# Patient Record
Sex: Female | Born: 1980 | Race: Black or African American | Hispanic: No | Marital: Single | Smoking: Current some day smoker
Health system: Southern US, Community
[De-identification: ages and names within clinical notes are randomized; demographics above are authoritative.]

## PROBLEM LIST (undated history)

## (undated) ENCOUNTER — Inpatient Hospital Stay (HOSPITAL_COMMUNITY): Payer: Self-pay

## (undated) DIAGNOSIS — A749 Chlamydial infection, unspecified: Secondary | ICD-10-CM

## (undated) DIAGNOSIS — F419 Anxiety disorder, unspecified: Secondary | ICD-10-CM

## (undated) HISTORY — PX: NO PAST SURGERIES: SHX2092

## (undated) HISTORY — DX: Chlamydial infection, unspecified: A74.9

---
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
## 2001-05-21 ENCOUNTER — Encounter: Payer: Self-pay | Admitting: Family Medicine

## 2001-05-21 ENCOUNTER — Ambulatory Visit (HOSPITAL_COMMUNITY): Admission: RE | Admit: 2001-05-21 | Discharge: 2001-05-21 | Payer: Self-pay | Admitting: Family Medicine

## 2003-10-01 ENCOUNTER — Emergency Department (HOSPITAL_COMMUNITY): Admission: EM | Admit: 2003-10-01 | Discharge: 2003-10-01 | Payer: Self-pay | Admitting: Emergency Medicine

## 2004-10-12 ENCOUNTER — Inpatient Hospital Stay (HOSPITAL_COMMUNITY): Admission: AD | Admit: 2004-10-12 | Discharge: 2004-10-14 | Payer: Self-pay | Admitting: Obstetrics

## 2005-11-05 ENCOUNTER — Inpatient Hospital Stay (HOSPITAL_COMMUNITY): Admission: AD | Admit: 2005-11-05 | Discharge: 2005-11-05 | Payer: Self-pay | Admitting: Obstetrics

## 2006-10-12 ENCOUNTER — Emergency Department (HOSPITAL_COMMUNITY): Admission: EM | Admit: 2006-10-12 | Discharge: 2006-10-12 | Payer: Self-pay | Admitting: *Deleted

## 2007-04-21 ENCOUNTER — Emergency Department (HOSPITAL_COMMUNITY): Admission: EM | Admit: 2007-04-21 | Discharge: 2007-04-21 | Payer: Self-pay | Admitting: Emergency Medicine

## 2008-05-20 ENCOUNTER — Emergency Department (HOSPITAL_COMMUNITY): Admission: EM | Admit: 2008-05-20 | Discharge: 2008-05-20 | Payer: Self-pay | Admitting: Emergency Medicine

## 2009-05-12 ENCOUNTER — Emergency Department (HOSPITAL_COMMUNITY): Admission: EM | Admit: 2009-05-12 | Discharge: 2009-05-12 | Payer: Self-pay | Admitting: Emergency Medicine

## 2009-08-13 ENCOUNTER — Emergency Department (HOSPITAL_COMMUNITY): Admission: EM | Admit: 2009-08-13 | Discharge: 2009-08-13 | Payer: Self-pay | Admitting: Emergency Medicine

## 2009-09-18 ENCOUNTER — Ambulatory Visit (HOSPITAL_COMMUNITY): Admission: RE | Admit: 2009-09-18 | Discharge: 2009-09-18 | Payer: Self-pay | Admitting: Obstetrics

## 2010-02-12 ENCOUNTER — Inpatient Hospital Stay (HOSPITAL_COMMUNITY): Admission: AD | Admit: 2010-02-12 | Discharge: 2010-02-15 | Payer: Self-pay | Admitting: Obstetrics

## 2010-10-21 LAB — CBC
HCT: 32.5 % — ABNORMAL LOW (ref 36.0–46.0)
HCT: 35 % — ABNORMAL LOW (ref 36.0–46.0)
Hemoglobin: 11.3 g/dL — ABNORMAL LOW (ref 12.0–15.0)
Hemoglobin: 12.2 g/dL (ref 12.0–15.0)
MCH: 32.9 pg (ref 26.0–34.0)
MCH: 33 pg (ref 26.0–34.0)
MCHC: 34.7 g/dL (ref 30.0–36.0)
MCHC: 34.8 g/dL (ref 30.0–36.0)
MCV: 94.7 fL (ref 78.0–100.0)
MCV: 94.8 fL (ref 78.0–100.0)
Platelets: 249 10*3/uL (ref 150–400)
Platelets: 306 10*3/uL (ref 150–400)
RBC: 3.43 MIL/uL — ABNORMAL LOW (ref 3.87–5.11)
RBC: 3.69 MIL/uL — ABNORMAL LOW (ref 3.87–5.11)
RDW: 14.2 % (ref 11.5–15.5)
RDW: 14.4 % (ref 11.5–15.5)
WBC: 10.5 10*3/uL (ref 4.0–10.5)
WBC: 17.9 10*3/uL — ABNORMAL HIGH (ref 4.0–10.5)

## 2010-10-21 LAB — SYPHILIS: RPR W/REFLEX TO RPR TITER AND TREPONEMAL ANTIBODIES, TRADITIONAL SCREENING AND DIAGNOSIS ALGORITHM: RPR Ser Ql: NONREACTIVE

## 2011-11-12 ENCOUNTER — Emergency Department (HOSPITAL_COMMUNITY)
Admission: EM | Admit: 2011-11-12 | Discharge: 2011-11-12 | Disposition: A | Discharge status: Home / Self Care | Payer: Medicaid Other | Attending: Emergency Medicine | Admitting: Emergency Medicine

## 2011-11-12 ENCOUNTER — Encounter (HOSPITAL_COMMUNITY): Payer: Self-pay | Admitting: Emergency Medicine

## 2011-11-12 ENCOUNTER — Emergency Department (HOSPITAL_COMMUNITY): Payer: Medicaid Other

## 2011-11-12 DIAGNOSIS — F410 Panic disorder [episodic paroxysmal anxiety] without agoraphobia: Secondary | ICD-10-CM | POA: Insufficient documentation

## 2011-11-12 DIAGNOSIS — R630 Anorexia: Secondary | ICD-10-CM | POA: Insufficient documentation

## 2011-11-12 DIAGNOSIS — F341 Dysthymic disorder: Secondary | ICD-10-CM | POA: Insufficient documentation

## 2011-11-12 HISTORY — DX: Anxiety disorder, unspecified: F41.9

## 2011-11-12 MED ORDER — ALPRAZOLAM 0.5 MG PO TABS: 0.5000 mg | ORAL_TABLET | Freq: Three times a day (TID) | ORAL | Status: AC | PRN

## 2011-11-12 NOTE — ED Provider Notes (Signed)
Medical screening examination/treatment/procedure(s) were performed by non-physician practitioner and as supervising physician I was immediately available for consultation/collaboration.  Quinisha Mould, MD 11/12/11 2318 

## 2011-11-12 NOTE — ED Notes (Signed)
Pt reports for the past week with panic attacks. Pt reports had similar episodes in the past. Pt reports unable to sit still and mind racing. Pt denies SI/HI.

## 2011-11-12 NOTE — ED Notes (Signed)
Pt d/c home in NAD. Pt voiced understanding of d/c instructions and prescriptions. Instructed not to drive after taking xanax.

## 2011-11-12 NOTE — ED Notes (Addendum)
Pt states that she has had anxiety attacks xseveral years. Saw counselor a couple of years back but has cont to have anxiety. Pt states that she is here because "I am older and hoped these would have gone away by now." Denies anxiety today but states she did have 1 yesterday. Wants help in finding a place to go for psych help. States currently she feels like she has never been able to concentrate on things because she is always getting distracted.

## 2011-11-12 NOTE — Discharge Instructions (Signed)
Read the information below.  Please call the phone number above for [facility] (mental health facility) for further evaluation and treatment of your depression and anxiety.  Please also use the resources below to find a primary care provider for follow up.  You may return to the ER at any time for worsening condition or any new symptoms that concern you.   Anxiety and Panic Attacks Your caregiver has informed you that you are having an anxiety or panic attack. There may be many forms of this. Most of the time these attacks come suddenly and without warning. They come at any time of day, including periods of sleep, and at any time of life. They may be strong and unexplained. Although panic attacks are very scary, they are physically harmless. Sometimes the cause of your anxiety is not known. Anxiety is a protective mechanism of the body in its fight or flight mechanism. Most of these perceived danger situations are actually nonphysical situations (such as anxiety over losing a job). CAUSES  The causes of an anxiety or panic attack are many. Panic attacks may occur in otherwise healthy people given a certain set of circumstances. There may be a genetic cause for panic attacks. Some medications may also have anxiety as a side effect. SYMPTOMS  Some of the most common feelings are:  Intense terror.   Dizziness, feeling faint.   Hot and cold flashes.   Fear of going crazy.   Feelings that nothing is real.   Sweating.   Shaking.   Chest pain or a fast heartbeat (palpitations).   Smothering, choking sensations.   Feelings of impending doom and that death is near.   Tingling of extremities, this may be from over-breathing.   Altered reality (derealization).   Being detached from yourself (depersonalization).  Several symptoms can be present to make up anxiety or panic attacks. DIAGNOSIS  The evaluation by your caregiver will depend on the type of symptoms you are experiencing. The diagnosis  of anxiety or panic attack is made when no physical illness can be determined to be a cause of the symptoms. TREATMENT  Treatment to prevent anxiety and panic attacks may include:  Avoidance of circumstances that cause anxiety.   Reassurance and relaxation.   Regular exercise.   Relaxation therapies, such as yoga.   Psychotherapy with a psychiatrist or therapist.   Avoidance of caffeine, alcohol and illegal drugs.   Prescribed medication.  SEEK IMMEDIATE MEDICAL CARE IF:   You experience panic attack symptoms that are different than your usual symptoms.   You have any worsening or concerning symptoms.  Document Released: 07/22/2005 Document Revised: 07/11/2011 Document Reviewed: 11/23/2009 [facility] Patient Information 2012 [place], [place].  If you have no primary doctor, here are some resources that may be helpful:  Medicaid-accepting [facility] Providers:   - [facility]- [address]      [phone]      Mon-Fri 9am-7pm, Sat 9am-1pm   - [facility]- [address] [place]      [phone]   - [facility]- [address] [place]      [phone]   [facility]- [address]      [phone]   - Renaye Rakers- [address] [place], [address]      [phone]      Only accepts [place] patients       after they  have her name applied to their card   Self Pay (no insurance) in [facility]:   - Sickle Cell Patients: Dr Willey Blade, [facility]      [address] [place]      [phone]   - [facility] [phone]   - Physician Referral Service- [phone]   - [facility]- [address] [place]      [phone]   [facility] [place]- [address]   - [facility]- see information above      (Speak to [employer] if you do not have insurance)   - [facility]- [address] [place]      [phone]   - [facility] [place]- [address]      [phone]   - [facility]- [address]      [phone]   - Dr Julio Sicks-  [address], [place]      [phone]   - [facility]- [address]      [phone]   - [facility]- [address]      [phone]      Also [address]      [phone]   - [facility]- [address]      [phone]      1st and 3rd Saturday every month, 10am-1pm Other agencies that provide inexpensive medical care:    [facility]  [phone]    [facility]  [phone]    [facility]  [phone]    [facility]  [phone]    [facility]  [phone]  General Information: Finding a doctor when you do not have health insurance can be tricky. Although you are not limited by an insurance plan, you are of course limited by her finances and how much but he can pay out of pocket.  What are your options if you don't have health insurance?   1) Find a Librarian, academic and Pay Out of Pocket Although you won't have to find out who is covered by your insurance plan, it is a good idea to ask around and get recommendations. You will then need to call the office and see if the doctor you have chosen will accept you as a new patient and what types of options they offer for patients who are self-pay. Some doctors offer discounts or will set up payment plans for their patients who do not have insurance, but you will need to ask so you aren't surprised when you get to your appointment.  2) Contact Your Local Health Department Not all health departments have doctors that can see patients for sick visits, but many do, so it is worth a call to see if yours does. If you don't know where your local health department is, you can check in your phone book. The CDC also has a tool to help you locate your state's health department, and many state websites also  have listings of all of their local health departments.  3) Find a Walk-in Clinic If your illness is not likely to be very severe or complicated, you may want to try a walk in clinic. These are popping up all over the country in pharmacies, drugstores, and shopping centers. They're usually staffed by nurse practitioners or physician assistants that have been trained to treat common illnesses  and complaints. They're usually fairly quick and inexpensive. However, if you have serious medical issues or chronic medical problems, these are probably not your best option  RESOURCE GUIDE  Dental Problems  Patients with Medicaid: [facility] [phone] [address]                                           [phone] [address] Phone:  [phone]                                                  Phone:  [phone]  If unable to pay or uninsured, contact:  [facility] or [facility]. to become qualified for the adult dental clinic.  Chronic Pain Problems Contact [facility]  [phone] Patients need to be referred by their primary care doctor.  Insufficient Money for Medicine Contact United Way:  call "211" or [facility] [phone].  No Primary Care Doctor Call [facility]  [phone] Other agencies that provide inexpensive medical care    [facility]  [phone]    [facility]  [phone]    [facility]  [phone]    [facility]  [phone]    [facility]  [phone]    [facility]  [phone]  Psychological Services [facility]  [phone] [facility] Services  [phone] [facility]   [phone] (emergency services [phone])  Substance Abuse Resources [facility] and Drug Services  [phone] [facility] [phone] [facility] [phone] Floydene Flock [phone] Residential & Outpatient Substance  Abuse Program  [phone]  Abuse/Neglect [facility] Child Abuse Hotline [phone])182-2732 [facility] Child Abuse Hotline 9563797321 (After Hours)  Emergency Shelter [facility] [phone]  Maternity Homes [facility] 9126236912 [facility] [phone]  MRSA Hotline #:   908-165-9932    [facility] Resources  [facility]     United Way                          [facility] Dept. [address] [place]                       [address]      [address]  [place]                                                Cristobal Goldmann Phone:  [phone]                                   Phone:  [phone]  Phone:  [phone]  [facility] Phone:  [phone]  [facility] Child Abuse Hotline 604-335-8958 [phone] (After Hours)

## 2011-11-12 NOTE — ED Provider Notes (Signed)
History     CSN: [id number]  Arrival date & time 11/12/11  1148   First MD Initiated Contact with Patient 11/12/11 1248      Chief Complaint  Patient presents with  . Panic Attack    (Consider location/radiation/quality/duration/timing/severity/associated sxs/prior treatment) HPI Comments: Patient states that for the past several years she has had panic attacks.  States that she suddenly because short of breath, occasionally with chest pain, occasionally with lightheadedness.  States that she always feels like she can't get any air into her lungs.  The symptoms are exacerbated by worrying or thinking about stressful things.  States that she is a very emotional person and takes things a lot harder than other people.  States when she is alone she feels depressed, sad and lonely, and decreased appetite.  Denies SI, HI.  States last episode was 2 days ago.  Denies current SOB or CP.    The history is provided by the patient.    Past Medical History  Diagnosis Date  . Anxiety     History reviewed. No pertinent past surgical history.  History reviewed. No pertinent family history.  History  Substance Use Topics  . Smoking status: Current Everyday Smoker  . Smokeless tobacco: Not on file  . Alcohol Use: Yes    OB History    Grav Para Term Preterm Abortions TAB SAB Ect Mult Living                  Review of Systems  Constitutional: Negative for fever.  Respiratory: Negative for cough.   Gastrointestinal: Negative for vomiting, abdominal pain and diarrhea.  Genitourinary: Negative for dysuria, urgency, frequency and menstrual problem.  Neurological: Negative for dizziness, syncope, weakness and numbness.  All other systems reviewed and are negative.    Allergies  Review of patient's allergies indicates no known allergies.  Home Medications  No current outpatient prescriptions on file.  BP 109/71  Pulse 80  Temp(Src) 98.1 F (36.7 C) (Oral)  Resp 18  SpO2 100%   LMP 11/05/2011  Physical Exam  Nursing note and vitals reviewed. Constitutional: She is oriented to person, place, and time. She appears well-developed and well-nourished. No distress.  HENT:  Head: Normocephalic and atraumatic.  Neck: Normal range of motion. Neck supple.  Cardiovascular: Normal rate, regular rhythm and normal heart sounds.   Pulmonary/Chest: Breath sounds normal. No respiratory distress. She has no wheezes. She has no rales. She exhibits no tenderness.  Abdominal: Soft. Bowel sounds are normal. She exhibits no distension and no mass. There is no tenderness. There is no rebound and no guarding.  Musculoskeletal: Normal range of motion.  Neurological: She is alert and oriented to person, place, and time.  Skin: She is not diaphoretic.  Psychiatric: She has a normal mood and affect. Her behavior is normal. Judgment and thought content normal.    ED Course  Procedures (including critical care time)  Labs Reviewed - No data to display Dg Chest 2 View  11/12/2011  *RADIOLOGY REPORT*  Clinical Data: Panic attack, shortness of breath, smoker.  CHEST - 2 VIEW  Comparison: 05/20/2008  Findings: Hyperinflation consistent with COPD.  No infiltrates or nodules.  Bilateral nipple shadows.   Normal cardiac and mediastinal silhouette.   No effusion or pneumothorax.    Slight scoliosis convex right.  IMPRESSION: Hyperinflation consistent with COPD.  Similar appearance to priors.  Original Report Authenticated By: Elsie Stain, M.D.    Date: 11/12/2011   Rate: 73  Rhythm: normal sinus rhythm and sinus arrhythmia  QRS Axis: normal  Intervals: normal  ST/T Wave abnormalities: normal  Conduction Disutrbances:none  Narrative Interpretation:   Old EKG Reviewed: none available    1. Panic attacks       MDM  Patient with history of presumed panic attacks.  These have been ongoing and unchanged for years.  Patient came to ED for medication and resources.  Patient's last panic  attack was two days ago.  She was without symptoms throughout ED visit.  D/C home with resources for [facility] (mental health) as well as PCP.  Short term prescription for xanax given.          Dillard Cannon [place], [place] 11/12/11 1538

## 2012-03-06 ENCOUNTER — Inpatient Hospital Stay (HOSPITAL_COMMUNITY)
Admission: AD | Admit: 2012-03-06 | Discharge: 2012-03-06 | Discharge status: Against Medical Advice | Payer: Medicaid Other | Source: Ambulatory Visit | Attending: Obstetrics and Gynecology | Admitting: Obstetrics and Gynecology

## 2012-03-06 DIAGNOSIS — R109 Unspecified abdominal pain: Secondary | ICD-10-CM | POA: Insufficient documentation

## 2012-03-06 DIAGNOSIS — R110 Nausea: Secondary | ICD-10-CM | POA: Insufficient documentation

## 2012-03-06 LAB — URINALYSIS, ROUTINE W REFLEX MICROSCOPIC
Bilirubin Urine: NEGATIVE
Ketones, ur: NEGATIVE mg/dL
Leukocytes, UA: NEGATIVE
Protein, ur: NEGATIVE mg/dL
pH: 6 (ref 5.0–8.0)

## 2012-03-06 LAB — URINE MICROSCOPIC-ADD ON

## 2012-03-06 LAB — POCT PREGNANCY, URINE: Preg Test, Ur: NEGATIVE

## 2012-03-06 NOTE — MAU Note (Signed)
Patient states she has had nausea all week. Had upper abdominal pain earlier today but not in pain at this time. No bleeding.

## 2012-03-06 NOTE — MAU Note (Signed)
Called, not in lobby 

## 2015-04-25 ENCOUNTER — Encounter (HOSPITAL_COMMUNITY): Payer: Self-pay | Admitting: Emergency Medicine

## 2015-04-25 ENCOUNTER — Emergency Department (HOSPITAL_COMMUNITY)
Admission: EM | Admit: 2015-04-25 | Discharge: 2015-04-25 | Disposition: A | Discharge status: Home / Self Care | Payer: Medicaid Other | Attending: Emergency Medicine | Admitting: Emergency Medicine

## 2015-04-25 DIAGNOSIS — R0602 Shortness of breath: Secondary | ICD-10-CM | POA: Diagnosis present

## 2015-04-25 DIAGNOSIS — F419 Anxiety disorder, unspecified: Secondary | ICD-10-CM | POA: Insufficient documentation

## 2015-04-25 DIAGNOSIS — Z720 Tobacco use: Secondary | ICD-10-CM | POA: Diagnosis not present

## 2015-04-25 MED ORDER — BUSPIRONE HCL 10 MG PO TABS: 10.0000 mg | ORAL_TABLET | Freq: Three times a day (TID) | ORAL | Status: DC

## 2015-04-25 NOTE — Discharge Instructions (Signed)
Please use medication as directed, please follow-up with your primary care provider or Behavioral Health specialist for further evaluation and management.

## 2015-04-25 NOTE — ED Provider Notes (Signed)
CSN: [id number]     Arrival date & time 04/25/15  0925 History  This chart was scribed for non-physician practitioner, Eyvonne Mechanic, PA-C, working with Derwood Kaplan, MD, by Ronney Lion, ED Scribe. This patient was seen in room TR08C/TR08C and the patient's care was started at 10:26 AM.    Chief Complaint  Patient presents with  . Panic Attack    The history is provided by the patient. No language interpreter was used.    HPI Comments: Priscilla Sherman is a 34 y.o. female with a history of anxiety, who presents to the Emergency Department complaining of feeling anxious. She states she feels like she cannot "catch her breath" and feels lightheaded, and she is unable to relax. She tried to be seen here yesterday for the same but went home due to the 4-5 hour wait. She states her symptoms persisted overnight, so she returned this morning. Patient report  she was diagnosed with anxiety, depression, and ADHD at [facility] several months ago and given medication for depression and anxiety, although she is unable to recall which medication this was - she states she thinks it "begins with a 'C'." She states she tried taking it for a month, but she stopped taking it 2 weeks ago because it was ineffective and made her feel "not right." Patient has a follow-up appointment with Vesta Mixer in October. Patient states she initially went to [facility] for chronic anxiety to seek out medication, as she has been self-medicating with marijuana for some time but would like to stop doing this as soon as she finds finds effective long-term anxiety treatment. Patient denies chest pain or any other complaints.   Past Medical History  Diagnosis Date  . Anxiety    History reviewed. No pertinent past surgical history. No family history on file. Social History  Substance Use Topics  . Smoking status: Current Every Day Smoker -- 0.10 packs/day    Types: Cigarettes  . Smokeless tobacco: None  . Alcohol Use: Yes     Comment: socially    OB History    No data available     Review of Systems  All other systems reviewed and are negative.  Allergies  Review of patient's allergies indicates no known allergies.  Home Medications   Prior to Admission medications   Medication Sig Start Date End Date Taking? Authorizing Provider  busPIRone (BUSPAR) 10 MG tablet Take 1 tablet (10 mg total) by mouth 3 (three) times daily. 04/25/15   Jeffrey Hedges, PA-C   BP 123/79 mmHg  Pulse 61  Temp(Src) 98.3 F (36.8 C) (Oral)  Resp 18  Ht  (1.575 m)  Wt 130 lb (58.968 kg)  BMI 23.77 kg/m2  SpO2 100%  LMP 04/25/2015   Physical Exam  Constitutional: She is oriented to person, place, and time. She appears well-developed and well-nourished. No distress.  Somewhat anxious during evaluation  HENT:  Head: Normocephalic and atraumatic.  Eyes: Conjunctivae and EOM are normal.  Neck: Neck supple. No tracheal deviation present.  Cardiovascular: Normal rate, regular rhythm, normal heart sounds and intact distal pulses.  Exam reveals no gallop and no friction rub.   No murmur heard. Pulmonary/Chest: Effort normal. No respiratory distress.  Musculoskeletal: Normal range of motion.  Neurological: She is alert and oriented to person, place, and time.  Skin: Skin is warm and dry.  Psychiatric: She has a normal mood and affect. Her behavior is normal.  Nursing note and vitals reviewed.   ED Course  Procedures (  including critical care time)  MDM   Final diagnoses:  Anxiety    Labs: n/a  Imaging: n/a  Consults: n/a  Therapeutics: n/a  Discharge Meds: Buspar 10 mg TID  Assessment/Plan: Patient here with anxiety. History of the same, with no improvement in symptoms with previously prescribed medications. Patient has no chest pain, significant shortness of breath, or any other concerning signs or symptoms for anything other than anxiety. Discussed treatment plan with pt, which includes Rx anti-anxiety medication. Pt  verbalized understanding and agreed to plan.  I personally performed the services described in this documentation, which was scribed in my presence. The recorded information has been reviewed and is accurate.     Eyvonne Mechanic, PA-C 04/25/15 1645  Derwood Kaplan, MD 04/25/15 [phone]

## 2015-04-25 NOTE — ED Notes (Signed)
Patient states she has been diagnosed in the last several months with anxiety, panic attacks, ADHD.   She states she was recently put on medication to cover her for anxiety, but she states it covers for depression also.  Patient states she has panic attacks where her heart beats fast and gets dizzy, but not having anything today.   Patient states she came in yesterday for same, but didn't want to wait.   Patient states her mind "moves around a lot and I'm light sleeping".

## 2015-04-25 NOTE — ED Notes (Signed)
PT was seen at [facility] and DX with ADD, Anxiety and depression. Pt was started on meds by [facility] but did not bring  The med with her today.

## 2015-06-01 ENCOUNTER — Inpatient Hospital Stay (HOSPITAL_COMMUNITY): Payer: Medicaid Other

## 2015-06-01 ENCOUNTER — Encounter (HOSPITAL_COMMUNITY): Payer: Self-pay

## 2015-06-01 ENCOUNTER — Inpatient Hospital Stay (HOSPITAL_COMMUNITY)
Admission: AD | Admit: 2015-06-01 | Discharge: 2015-06-01 | Disposition: A | Discharge status: Home / Self Care | Payer: Medicaid Other | Source: Ambulatory Visit | Attending: Family Medicine | Admitting: Family Medicine

## 2015-06-01 DIAGNOSIS — F17210 Nicotine dependence, cigarettes, uncomplicated: Secondary | ICD-10-CM | POA: Insufficient documentation

## 2015-06-01 DIAGNOSIS — N760 Acute vaginitis: Secondary | ICD-10-CM | POA: Diagnosis not present

## 2015-06-01 DIAGNOSIS — O23591 Infection of other part of genital tract in pregnancy, first trimester: Secondary | ICD-10-CM | POA: Diagnosis not present

## 2015-06-01 DIAGNOSIS — B9689 Other specified bacterial agents as the cause of diseases classified elsewhere: Secondary | ICD-10-CM

## 2015-06-01 DIAGNOSIS — A499 Bacterial infection, unspecified: Secondary | ICD-10-CM

## 2015-06-01 DIAGNOSIS — O99331 Smoking (tobacco) complicating pregnancy, first trimester: Secondary | ICD-10-CM | POA: Diagnosis not present

## 2015-06-01 DIAGNOSIS — O26899 Other specified pregnancy related conditions, unspecified trimester: Secondary | ICD-10-CM

## 2015-06-01 DIAGNOSIS — Z3A01 Less than 8 weeks gestation of pregnancy: Secondary | ICD-10-CM | POA: Diagnosis not present

## 2015-06-01 DIAGNOSIS — R109 Unspecified abdominal pain: Secondary | ICD-10-CM | POA: Insufficient documentation

## 2015-06-01 LAB — URINALYSIS, ROUTINE W REFLEX MICROSCOPIC
Bilirubin Urine: NEGATIVE
Glucose, UA: NEGATIVE mg/dL
Hgb urine dipstick: NEGATIVE
Ketones, ur: 15 mg/dL — AB
LEUKOCYTES UA: NEGATIVE
NITRITE: NEGATIVE
PROTEIN: NEGATIVE mg/dL
Specific Gravity, Urine: 1.025 (ref 1.005–1.030)
UROBILINOGEN UA: 4 mg/dL — AB (ref 0.0–1.0)
pH: 6 (ref 5.0–8.0)

## 2015-06-01 LAB — WET PREP, GENITAL
TRICH WET PREP: NONE SEEN
Yeast Wet Prep HPF POC: NONE SEEN

## 2015-06-01 LAB — HCG, QUANTITATIVE, PREGNANCY: HCG, BETA CHAIN, QUANT, S: 7393 m[IU]/mL — AB (ref <5)

## 2015-06-01 LAB — CBC
HCT: 36.9 % (ref 36.0–46.0)
HEMOGLOBIN: 12.8 g/dL (ref 12.0–15.0)
MCH: 31.1 pg (ref 26.0–34.0)
MCHC: 34.7 g/dL (ref 30.0–36.0)
MCV: 89.8 fL (ref 78.0–100.0)
Platelets: 364 10*3/uL (ref 150–400)
RBC: 4.11 MIL/uL (ref 3.87–5.11)
RDW: 13.5 % (ref 11.5–15.5)
WBC: 11 10*3/uL — ABNORMAL HIGH (ref 4.0–10.5)

## 2015-06-01 LAB — ABO/RH: ABO/RH(D): B POS

## 2015-06-01 LAB — OB RESULTS CONSOLE GC/CHLAMYDIA: GC PROBE AMP, GENITAL: NEGATIVE

## 2015-06-01 LAB — POCT PREGNANCY, URINE: PREG TEST UR: POSITIVE — AB

## 2015-06-01 MED ORDER — METRONIDAZOLE 500 MG PO TABS: 500.0000 mg | ORAL_TABLET | Freq: Two times a day (BID) | ORAL | Status: DC

## 2015-06-01 NOTE — MAU Note (Signed)
Patient presents with abdominal cramping, missed period, positive home UPT, reports having diarrhea for 2 weeks.

## 2015-06-01 NOTE — MAU Provider Note (Signed)
History     CSN: [id number]  Arrival date and time: 06/01/15 1434   First Provider Initiated Contact with Patient 06/01/15 1455      Chief Complaint  Patient presents with  . Abdominal Cramping  . Amenorrhea  . Diarrhea   HPI Pt is [redacted]w[redacted]d pregnant [id number] who presents with nausea x2 weeks and diarrheax2 weeks with abdominal cramping; Cramping does not seem to be related to diarrhea.  Pt denies fever, chills, UTI sx, spotting , bleeding or dyspareunia.  Pt has seen at [facility] with new prescription of wellbutrin and buspar- previously was on citalopram and didn't feel any relief. Pt thinks that could be responsible for her diarrhea- which seems to be getting better Pt has not taken any medications for her diarrhea or her cramping or nausea RN note:      Expand All Collapse All   Patient presents with abdominal cramping, missed period, positive home UPT, reports having diarrhea for 2 weeks.         Past Medical History  Diagnosis Date  . Anxiety     Past Surgical History  Procedure Laterality Date  . No past surgeries      No family history on file.  Social History  Substance Use Topics  . Smoking status: Current Every Day Smoker -- 0.10 packs/day    Types: Cigarettes  . Smokeless tobacco: None  . Alcohol Use: Yes     Comment: socially    Allergies: No Known Allergies  Prescriptions prior to admission  Medication Sig Dispense Refill Last Dose  . buPROPion (WELLBUTRIN XL) 150 MG 24 hr tablet Take 150 mg by mouth daily.   05/31/2015 at Unknown time  . busPIRone (BUSPAR) 10 MG tablet Take 1 tablet (10 mg total) by mouth 3 (three) times daily. 20 tablet 0 05/31/2015 at Unknown time    Review of Systems  Constitutional: Negative for fever and chills.  Gastrointestinal: Positive for nausea, abdominal pain and diarrhea. Negative for vomiting.  Neurological: Positive for dizziness. Negative for headaches.       Sometimes-   Psychiatric/Behavioral: The patient is  nervous/anxious.        Has been taking antianxiety medications from [facility]   Physical Exam   Blood pressure 126/79, pulse 90, temperature 98.5 F (36.9 C), temperature source Oral, resp. rate 16, last menstrual period 04/20/2015.  Physical Exam  Nursing note and vitals reviewed. Constitutional: She is oriented to person, place, and time. She appears well-developed and well-nourished. No distress.  HENT:  Head: Normocephalic.  Eyes: Pupils are equal, round, and reactive to light.  Neck: Normal range of motion. Neck supple.  Cardiovascular: Normal rate.   Respiratory: Effort normal.  GI: Soft.  Musculoskeletal: Normal range of motion.  Neurological: She is alert and oriented to person, place, and time.  Skin: Skin is warm and dry.  Psychiatric: She has a normal mood and affect.    MAU Course  Procedures Results for orders placed or performed during the hospital encounter of 06/01/15 (from the past 24 hour(s))  Urinalysis, Routine w reflex microscopic (not at [facility])     Status: Abnormal   Collection Time: 06/01/15  2:40 PM  Result Value Ref Range   Color, Urine YELLOW YELLOW   APPearance CLEAR CLEAR   Specific Gravity, Urine 1.025 1.005 - 1.030   pH 6.0 5.0 - 8.0   Glucose, UA NEGATIVE NEGATIVE mg/dL   Hgb urine dipstick NEGATIVE NEGATIVE   Bilirubin Urine NEGATIVE NEGATIVE   Ketones, ur  15 (A) NEGATIVE mg/dL   Protein, ur NEGATIVE NEGATIVE mg/dL   Urobilinogen, UA 4.0 (H) 0.0 - 1.0 mg/dL   Nitrite NEGATIVE NEGATIVE   Leukocytes, UA NEGATIVE NEGATIVE  Pregnancy, urine POC     Status: Abnormal   Collection Time: 06/01/15  2:44 PM  Result Value Ref Range   Preg Test, Ur POSITIVE (A) NEGATIVE  CBC     Status: Abnormal   Collection Time: 06/01/15  3:16 PM  Result Value Ref Range   WBC 11.0 (H) 4.0 - 10.5 K/uL   RBC 4.11 3.87 - 5.11 MIL/uL   Hemoglobin 12.8 12.0 - 15.0 g/dL   HCT 40.936.9 81.136.0 - 91.446.0 %   MCV 89.8 78.0 - 100.0 fL   MCH 31.1 26.0 - 34.0 pg   MCHC 34.7 30.0 -  36.0 g/dL   RDW 78.213.5 95.611.5 - 21.315.5 %   Platelets 364 150 - 400 K/uL  ABO/Rh     Status: None   Collection Time: 06/01/15  3:16 PM  Result Value Ref Range   ABO/RH(D) B POS   hCG, quantitative, pregnancy     Status: Abnormal   Collection Time: 06/01/15  3:16 PM  Result Value Ref Range   hCG, Beta Chain, Quant, S 7393 (H) <5 mIU/mL  Wet prep, genital     Status: Abnormal   Collection Time: 06/01/15  3:20 PM  Result Value Ref Range   Yeast Wet Prep HPF POC NONE SEEN NONE SEEN   Trich, Wet Prep NONE SEEN NONE SEEN   Clue Cells Wet Prep HPF POC MANY (A) NONE SEEN   WBC, Wet Prep HPF POC FEW (A) NONE SEEN  [place] Ob Comp Less 14 Wks  06/01/2015  CLINICAL DATA:  First trimester of pregnancy, lower abdominal pain. EXAM: OBSTETRIC <14 WK US AND TRANSVAGINAL OB US TECHNIQUE: Both transabdominal and transvaginal ultrasound examinations were performed for complete evaluation of the gestation as well as the maternal uterus, adnexal regions, and pelvic cul-de-sac. Transvaginal technique was performed to assess early pregnancy. COMPARISON:  None. FINDINGS: Intrauterine gestational sac: Visualized/normal in shape. Yolk sac:  Not visualized. Embryo:  Not visualized. Cardiac Activity: Not visualized. MSD: 5.4  mm   5 w   2  d Maternal uterus/adnexae: Both ovaries appear normal, except for corpus luteum cyst in right ovary. Trace free fluid is noted which most likely is physiologic. IMPRESSION: Probable early intrauterine gestational sac, but no yolk sac, fetal pole, or cardiac activity yet visualized. Recommend follow-up quantitative B-HCG levels and follow-up US in 14 days to confirm and assess viability. This recommendation follows SRU consensus guidelines: Diagnostic Criteria for Nonviable Pregnancy Early in the First Trimester. Malva Limes Engl J Med 2013; 086:5784-69; 369:1443-51. Electronically Signed   By: Lupita RaiderJames  Green Jr, M.D.   On: 06/01/2015 17:19   [place] Ob Transvaginal  06/01/2015  CLINICAL DATA:  First trimester of pregnancy,  lower abdominal pain. EXAM: OBSTETRIC <14 WK US AND TRANSVAGINAL OB US TECHNIQUE: Both transabdominal and transvaginal ultrasound examinations were performed for complete evaluation of the gestation as well as the maternal uterus, adnexal regions, and pelvic cul-de-sac. Transvaginal technique was performed to assess early pregnancy. COMPARISON:  None. FINDINGS: Intrauterine gestational sac: Visualized/normal in shape. Yolk sac:  Not visualized. Embryo:  Not visualized. Cardiac Activity: Not visualized. MSD: 5.4  mm   5 w   2  d Maternal uterus/adnexae: Both ovaries appear normal, except for corpus luteum cyst in right ovary. Trace free fluid is noted which most likely is physiologic.  IMPRESSION: Probable early intrauterine gestational sac, but no yolk sac, fetal pole, or cardiac activity yet visualized. Recommend follow-up quantitative B-HCG levels and follow-up US in 14 days to confirm and assess viability. This recommendation follows SRU consensus guidelines: Diagnostic Criteria for Nonviable Pregnancy Early in the First Trimester. Malva Limes Med 2013; 161:0960-45. Electronically Signed   By: Lupita Raider, M.D.   On: 06/01/2015 17:19    Assessment and Plan  Abdominal pain in first trimester Pregnancy of unknown location- repeat HCG in 48 hours BV- Flagyl  BID for 7 days Return sooner for any increase in pain or bleeding  Marranda Arakelian 06/01/2015, 2:55 PM

## 2015-06-02 LAB — GC/CHLAMYDIA PROBE AMP (~~LOC~~) NOT AT ARMC
Chlamydia: NEGATIVE
NEISSERIA GONORRHEA: NEGATIVE

## 2015-06-02 LAB — HIV ANTIBODY (ROUTINE TESTING W REFLEX): HIV Screen 4th Generation wRfx: NONREACTIVE

## 2015-07-19 ENCOUNTER — Inpatient Hospital Stay (HOSPITAL_COMMUNITY)
Admission: AD | Admit: 2015-07-19 | Discharge: 2015-07-19 | Disposition: A | Discharge status: Home / Self Care | Payer: Medicaid Other | Source: Ambulatory Visit | Attending: Obstetrics & Gynecology | Admitting: Obstetrics & Gynecology

## 2015-07-19 DIAGNOSIS — Z3491 Encounter for supervision of normal pregnancy, unspecified, first trimester: Secondary | ICD-10-CM

## 2015-07-19 DIAGNOSIS — O99331 Smoking (tobacco) complicating pregnancy, first trimester: Secondary | ICD-10-CM | POA: Insufficient documentation

## 2015-07-19 DIAGNOSIS — F17210 Nicotine dependence, cigarettes, uncomplicated: Secondary | ICD-10-CM | POA: Insufficient documentation

## 2015-07-19 DIAGNOSIS — R109 Unspecified abdominal pain: Secondary | ICD-10-CM | POA: Diagnosis present

## 2015-07-19 DIAGNOSIS — Z3A12 12 weeks gestation of pregnancy: Secondary | ICD-10-CM | POA: Diagnosis not present

## 2015-07-19 DIAGNOSIS — O26891 Other specified pregnancy related conditions, first trimester: Secondary | ICD-10-CM | POA: Insufficient documentation

## 2015-07-19 NOTE — MAU Note (Signed)
Pt seen in MAU in October, was supposed to return to have repeat BHCG but was unable to.    Pt denies pain or bleeding, does have constipation.

## 2015-07-19 NOTE — MAU Provider Note (Signed)
History   [id number]   Chief Complaint  Patient presents with  . Follow-up    HPI Priscilla Sherman is a 34 y.o. female [phone] here for follow-up BHCG.  Upon review of the records patient was first seen on 10/27 for abdominal pain. BHCG on that day was 7393. Ultrasound showed IUGS with no yolk sac. Patient was discharged home & told to come back in 48 hrs for f/u BHCG. Patient did not return before now d/t being "busy with work".  Denies abdominal pain or vaginal bleeding.  Has not started prenatal care.  States she came today b/c she knew she was supposed to come back for follow up - has no complaints today.    Patient's last menstrual period was 04/20/2015.  OB History  Gravida Para Term Preterm AB SAB TAB Ectopic Multiple Living  4 2   1  1   2     # Outcome Date GA Lbr Len/2nd Weight Sex Delivery Anes PTL Lv  4 Current           3 TAB           2 Para           1 Para               Past Medical History  Diagnosis Date  . Anxiety     No family history on file.  Social History   Social History  . Marital Status: Single    Spouse Name: N/A  . Number of Children: N/A  . Years of Education: N/A   Social History Main Topics  . Smoking status: Current Every Day Smoker -- 0.10 packs/day    Types: Cigarettes  . Smokeless tobacco: Not on file  . Alcohol Use: Yes     Comment: socially  . Drug Use: Yes    Special: Marijuana     Comment: last used 2 weeks ago  . Sexual Activity: Not on file   Other Topics Concern  . Not on file   Social History Narrative    No Known Allergies  No current facility-administered medications on file prior to encounter.   Current Outpatient Prescriptions on File Prior to Encounter  Medication Sig Dispense Refill  . buPROPion (WELLBUTRIN XL) 150 MG 24 hr tablet Take 150 mg by mouth daily.    . busPIRone (BUSPAR) 10 MG tablet Take 1 tablet (10 mg total) by mouth 3 (three) times daily. 20 tablet 0     Physical Exam   Filed Vitals:    07/19/15 0910  BP: 104/56  Pulse: 82  Temp: 97.8 F (36.6 C)  TempSrc: Oral  Resp: 18    Physical Exam  Constitutional: She appears well-developed and well-nourished. No distress.  HENT:  Head: Normocephalic and atraumatic.  Cardiovascular: Normal rate.   Respiratory: Effort normal. No respiratory distress.  Musculoskeletal: Normal range of motion.  Skin: She is not diaphoretic.  Psychiatric: She has a normal mood and affect. Her behavior is normal. Judgment and thought content normal.    MAU Course  Procedures  MDM FHT 165 by doppler Patient has no complaints at this time. Denies pain or bleeding.   Assessment and Plan  34 y.o. G4P0012 at 960w6d wks Pregnancy 1. Fetal heart tones present, first trimester    P: Discharge home Start prenatal care ASAP Pregnancy verification letter & list of providers given Discussed purpose of MAU & reasons to return Continue taking prenatal vitamin  Judeth HornErin Gracynn Rajewski, NP 07/19/2015 9:36  AM

## 2015-07-19 NOTE — Discharge Instructions (Signed)
First Trimester of Pregnancy The first trimester of pregnancy is from week 1 until the end of week 12 (months 1 through 3). During this time, your baby will begin to develop inside you. At 6-8 weeks, the eyes and face are formed, and the heartbeat can be seen on ultrasound. At the end of 12 weeks, all the baby's organs are formed. Prenatal care is all the medical care you receive before the birth of your baby. Make sure you get good prenatal care and follow all of your doctor's instructions. HOME CARE  Medicines  Take medicine only as told by your doctor. Some medicines are safe and some are not during pregnancy.  Take your prenatal vitamins as told by your doctor.  Take medicine that helps you poop (stool softener) as needed if your doctor says it is okay. Diet  Eat regular, healthy meals.  Your doctor will tell you the amount of weight gain that is right for you.  Avoid raw meat and uncooked cheese.  If you feel sick to your stomach (nauseous) or throw up (vomit):  Eat 4 or 5 small meals a day instead of 3 large meals.  Try eating a few soda crackers.  Drink liquids between meals instead of during meals.  If you have a hard time pooping (constipation):  Eat high-fiber foods like fresh vegetables, fruit, and whole grains.  Drink enough fluids to keep your pee (urine) clear or pale yellow. Activity and Exercise  Exercise only as told by your doctor. Stop exercising if you have cramps or pain in your lower belly (abdomen) or low back.  Try to avoid standing for long periods of time. Move your legs often if you must stand in one place for a long time.  Avoid heavy lifting.  Wear low-heeled shoes. Sit and stand up straight.  You can have sex unless your doctor tells you not to. Relief of Pain or Discomfort  Wear a good support bra if your breasts are sore.  Take warm water baths (sitz baths) to soothe pain or discomfort caused by hemorrhoids. Use hemorrhoid cream if your  doctor says it is okay.  Rest with your legs raised if you have leg cramps or low back pain.  Wear support hose if you have puffy, bulging veins (varicose veins) in your legs. Raise (elevate) your feet for 15 minutes, 3-4 times a day. Limit salt in your diet. Prenatal Care  Schedule your prenatal visits by the twelfth week of pregnancy.  Write down your questions. Take them to your prenatal visits.  Keep all your prenatal visits as told by your doctor. Safety  Wear your seat belt at all times when driving.  Make a list of emergency phone numbers. The list should include numbers for family, friends, the hospital, and police and fire departments. General Tips  Ask your doctor for a referral to a local prenatal class. Begin classes no later than at the start of month 6 of your pregnancy.  Ask for help if you need counseling or help with nutrition. Your doctor can give you advice or tell you where to go for help.  Do not use hot tubs, steam rooms, or saunas.  Do not douche or use tampons or scented sanitary pads.  Do not cross your legs for long periods of time.  Avoid litter boxes and soil used by cats.  Avoid all smoking, herbs, and alcohol. Avoid drugs not approved by your doctor.  Do not use any tobacco products, including cigarettes,  chewing tobacco, and electronic cigarettes. If you need help quitting, ask your doctor. You may get counseling or other support to help you quit. °· Visit your dentist. At home, brush your teeth with a soft toothbrush. Be gentle when you floss. °GET HELP IF: °· You are dizzy. °· You have mild cramps or pressure in your lower belly. °· You have a nagging pain in your belly area. °· You continue to feel sick to your stomach, throw up, or have watery poop (diarrhea). °· You have a bad smelling fluid coming from your vagina. °· You have pain with peeing (urination). °· You have increased puffiness (swelling) in your face, hands, legs, or ankles. °GET HELP  RIGHT AWAY IF:  °· You have a fever. °· You are leaking fluid from your vagina. °· You have spotting or bleeding from your vagina. °· You have very bad belly cramping or pain. °· You gain or lose weight rapidly. °· You throw up blood. It may look like coffee grounds. °· You are around people who have German measles, fifth disease, or chickenpox. °· You have a very bad headache. °· You have shortness of breath. °· You have any kind of trauma, such as from a fall or a car accident. °  °This information is not intended to replace advice given to you by your health care provider. Make sure you discuss any questions you have with your health care provider. °  °Document Released: 01/08/2008 Document Revised: 08/12/2014 Document Reviewed: 06/01/2013 °Elsevier Interactive Patient Education ©2016 Elsevier Inc. ° ° ° °Safe Medications in Pregnancy  ° °Acne: °Benzoyl Peroxide °Salicylic Acid ° °Backache/Headache: °Tylenol: 2 regular strength every 4 hours OR °             2 Extra strength every 6 hours ° °Colds/Coughs/Allergies: °Benadryl (alcohol free) 25 mg every 6 hours as needed °Breath right strips °Claritin °Cepacol throat lozenges °Chloraseptic throat spray °Cold-Eeze- up to three times per day °Cough drops, alcohol free °Flonase (by prescription only) °Guaifenesin °Mucinex °Robitussin DM (plain only, alcohol free) °Saline nasal spray/drops °Sudafed (pseudoephedrine) & Actifed ** use only after [redacted] weeks gestation and if you do not have high blood pressure °Tylenol °Vicks Vaporub °Zinc lozenges °Zyrtec  ° °Constipation: °Colace °Ducolax suppositories °Fleet enema °Glycerin suppositories °Metamucil °Milk of magnesia °Miralax °Senokot °Smooth move tea ° °Diarrhea: °Kaopectate °Imodium A-D ° °*NO pepto Bismol ° °Hemorrhoids: °Anusol °Anusol HC °Preparation H °Tucks ° °Indigestion: °Tums °Maalox °Mylanta °Zantac  °Pepcid ° °Insomnia: °Benadryl (alcohol free) 25mg every 6 hours as needed °Tylenol PM °Unisom, no Gelcaps ° °Leg  Cramps: °Tums °MagGel ° °Nausea/Vomiting:  °Bonine °Dramamine °Emetrol °Ginger extract °Sea bands °Meclizine  °Nausea medication to take during pregnancy:  °Unisom (doxylamine succinate 25 mg tablets) Take one tablet daily at bedtime. If symptoms are not adequately controlled, the dose can be increased to a maximum recommended dose of two tablets daily (1/2 tablet in the morning, 1/2 tablet mid-afternoon and one at bedtime). °Vitamin B6 100mg tablets. Take one tablet twice a day (up to 200 mg per day). ° °Skin Rashes: °Aveeno products °Benadryl cream or 25mg every 6 hours as needed °Calamine Lotion °1% cortisone cream ° °Yeast infection: °Gyne-lotrimin 7 °Monistat 7 ° ° °**If taking multiple medications, please check labels to avoid duplicating the same active ingredients °**take medication as directed on the label °** Do not exceed 4000 mg of tylenol in 24 hours °**Do not take medications that contain aspirin or ibuprofen ° ° ° ° °

## 2015-08-06 NOTE — L&D Delivery Note (Signed)
Delivery Note At  a viable female was delivered via  (Presentation: ; Occiput Anterior).  APGAR: , ; weight  .   Placenta status: Intact, Spontaneous.  Cord:  with the following complications: .  Cord pH: not done   Anesthesia:   Episiotomy:   Lacerations:   Suture Repair: 2.0 na Est. Blood Loss (mL):    Mom to postpartum.  Baby to Couplet care / Skin to Skin.  MARSHALL,BERNARD A 01/29/2016, 6:02 PM

## 2015-08-16 ENCOUNTER — Encounter: Payer: Self-pay | Admitting: Physician Assistant

## 2015-08-24 ENCOUNTER — Encounter: Payer: Self-pay | Admitting: Physician Assistant

## 2015-08-24 ENCOUNTER — Ambulatory Visit (INDEPENDENT_AMBULATORY_CARE_PROVIDER_SITE_OTHER): Payer: Medicaid Other | Admitting: Physician Assistant

## 2015-08-24 VITALS — BP 106/56 | HR 71 | Wt 125.3 lb

## 2015-08-24 DIAGNOSIS — Z3492 Encounter for supervision of normal pregnancy, unspecified, second trimester: Secondary | ICD-10-CM

## 2015-08-24 DIAGNOSIS — Z3490 Encounter for supervision of normal pregnancy, unspecified, unspecified trimester: Secondary | ICD-10-CM | POA: Insufficient documentation

## 2015-08-24 DIAGNOSIS — Z3482 Encounter for supervision of other normal pregnancy, second trimester: Secondary | ICD-10-CM

## 2015-08-24 LAB — POCT URINALYSIS DIP (DEVICE)
Bilirubin Urine: NEGATIVE
Glucose, UA: NEGATIVE mg/dL
KETONES UR: NEGATIVE mg/dL
Nitrite: NEGATIVE
PH: 6 (ref 5.0–8.0)
PROTEIN: NEGATIVE mg/dL
Urobilinogen, UA: 0.2 mg/dL (ref 0.0–1.0)

## 2015-08-24 MED ORDER — PRENATAL VITAMINS PLUS 27-1 MG PO TABS
1.0000 | ORAL_TABLET | Freq: Every day | ORAL | Status: DC
Start: 2015-08-24 — End: 2016-01-29

## 2015-08-24 NOTE — Progress Notes (Signed)
Nutrition note: 1st visit consult Pt has gained 10.3# @ 18w, which is wnl. Pt reports eating 4 meals/d. Pt is taking a PNV. Pt reports no N&V or heartburn. NKFA. Pt received verbal & written education on general nutrition during pregnancy. Discussed wt gain goals of 25-35# or 1#/wk. Pt agrees to continue taking a PNV. Pt does not have WIC but plans to apply. Pt plans to BF. F/u as needed Blondell Reveal, MS, RD, LDN, [facility]

## 2015-08-24 NOTE — Patient Instructions (Signed)
Prenatal Care °WHAT IS PRENATAL CARE?  °Prenatal care is the process of caring for a pregnant woman before she gives birth. Prenatal care makes sure that she and her baby remain as healthy as possible throughout pregnancy. Prenatal care may be provided by a midwife, family practice health care provider, or a childbirth and pregnancy specialist (obstetrician). Prenatal care may include physical examinations, testing, treatments, and education on nutrition, lifestyle, and social support services. °WHY IS PRENATAL CARE SO IMPORTANT?  °Early and consistent prenatal care increases the chance that you and your baby will remain healthy throughout your pregnancy. This type of care also decreases a baby's risk of being born too early (prematurely), or being born smaller than expected (small for gestational age). Any underlying medical conditions you may have that could pose a risk during your pregnancy are discussed during prenatal care visits. You will also be monitored regularly for any new conditions that may arise during your pregnancy so they can be treated quickly and effectively. °WHAT HAPPENS DURING PRENATAL CARE VISITS? °Prenatal care visits may include the following: °Discussion °Tell your health care provider about any new signs or symptoms you have experienced since your last visit. These might include: °· Nausea or vomiting. °· Increased or decreased level of energy. °· Difficulty sleeping. °· Back or leg pain. °· Weight changes. °· Frequent urination. °· Shortness of breath with physical activity. °· Changes in your skin, such as the development of a rash or itchiness. °· Vaginal discharge or bleeding. °· Feelings of excitement or nervousness. °· Changes in your baby's movements. °You may want to write down any questions or topics you want to discuss with your health care provider and bring them with you to your appointment. °Examination °During your first prenatal care visit, you will likely have a complete  physical exam. Your health care provider will often examine your vagina, cervix, and the position of your uterus, as well as check your heart, lungs, and other body systems. As your pregnancy progresses, your health care provider will measure the size of your uterus and your baby's position inside your uterus. He or she may also examine you for early signs of labor. Your prenatal visits may also include checking your blood pressure and, after about 10-12 weeks of pregnancy, listening to your baby's heartbeat. °Testing °Regular testing often includes: °· Urinalysis. This checks your urine for glucose, protein, or signs of infection. °· Blood count. This checks the levels of white and red blood cells in your body. °· Tests for sexually transmitted infections (STIs). Testing for STIs at the beginning of pregnancy is routinely done and is required in many states. °· Antibody testing. You will be checked to see if you are immune to certain illnesses, such as rubella, that can affect a developing fetus. °· Glucose screen. Around 24-28 weeks of pregnancy, your blood glucose level will be checked for signs of gestational diabetes. Follow-up tests may be recommended. °· Group B strep. This is a bacteria that is commonly found inside a woman's vagina. This test will inform your health care provider if you need an antibiotic to reduce the amount of this bacteria in your body prior to labor and childbirth. °· Ultrasound. Many pregnant women undergo an ultrasound screening around 18-20 weeks of pregnancy to evaluate the health of the fetus and check for any developmental abnormalities. °· HIV (human immunodeficiency virus) testing. Early in your pregnancy, you will be screened for HIV. If you are at high risk for HIV, this test   may be repeated during your third trimester of pregnancy. °You may be offered other testing based on your age, personal or family medical history, or other factors.  °HOW OFTEN SHOULD I PLAN TO SEE MY  HEALTH CARE PROVIDER FOR PRENATAL CARE? °Your prenatal care check-up schedule depends on any medical conditions you have before, or develop during, your pregnancy. If you do not have any underlying medical conditions, you will likely be seen for checkups: °· Monthly, during the first 6 months of pregnancy. °· Twice a month during months 7 and 8 of pregnancy. °· Weekly starting in the 9th month of pregnancy and until delivery. °If you develop signs of early labor or other concerning signs or symptoms, you may need to see your health care provider more often. Ask your health care provider what prenatal care schedule is best for you. °WHAT CAN I DO TO KEEP MYSELF AND MY BABY AS HEALTHY AS POSSIBLE DURING MY PREGNANCY? °· Take a prenatal vitamin containing 400 micrograms (0.4 mg) of folic acid every day. Your health care provider may also ask you to take additional vitamins such as iodine, vitamin D, iron, copper, and zinc. °· Take 1500-2000 mg of calcium daily starting at your 20th week of pregnancy until you deliver your baby. °· Make sure you are up to date on your vaccinations. Unless directed otherwise by your health care provider: °¨ You should receive a tetanus, diphtheria, and pertussis (Tdap) vaccination between the 27th and 36th week of your pregnancy, regardless of when your last Tdap immunization occurred. This helps protect your baby from whooping cough (pertussis) after he or she is born. °¨ You should receive an annual inactivated influenza vaccine (IIV) to help protect you and your baby from influenza. This can be done at any point during your pregnancy. °· Eat a well-rounded diet that includes: °¨ Fresh fruits and vegetables. °¨ Lean proteins. °¨ Calcium-rich foods such as milk, yogurt, hard cheeses, and dark, leafy greens. °¨ Whole grain breads. °· Do not eat seafood high in mercury, including: °¨ Swordfish. °¨ Tilefish. °¨ Shark. °¨ King mackerel. °¨ More than 6 oz tuna per week. °· Do not eat: °¨ Raw  or undercooked meats or eggs. °¨ Unpasteurized foods, such as soft cheeses (brie, blue, or feta), juices, and milks. °¨ Lunch meats. °¨ Hot dogs that have not been heated until they are steaming. °· Drink enough water to keep your urine clear or pale yellow. For many women, this may be 10 or more 8 oz glasses of water each day. Keeping yourself hydrated helps deliver nutrients to your baby and may prevent the start of pre-term uterine contractions. °· Do not use any tobacco products including cigarettes, chewing tobacco, or electronic cigarettes. If you need help quitting, ask your health care provider. °· Do not drink beverages containing alcohol. No safe level of alcohol consumption during pregnancy has been determined. °· Do not use any illegal drugs. These can harm your developing baby or cause a miscarriage. °· Ask your health care provider or pharmacist before taking any prescription or over-the-counter medicines, herbs, or supplements. °· Limit your caffeine intake to no more than 200 mg per day. °· Exercise. Unless told otherwise by your health care provider, try to get 30 minutes of moderate exercise most days of the week. Do not  do high-impact activities, contact sports, or activities with a high risk of falling, such as horseback riding or downhill skiing. °· Get plenty of rest. °· Avoid anything that raises your   body temperature, such as hot tubs and saunas. °· If you own a cat, do not empty its litter box. Bacteria contained in cat feces can cause an infection called toxoplasmosis. This can result in serious harm to the fetus. °· Stay away from chemicals such as insecticides, lead, mercury, and cleaning or paint products that contain solvents. °· Do not have any X-rays taken unless medically necessary. °· Take a childbirth and breastfeeding preparation class. Ask your health care provider if you need a referral or recommendation. °  °This information is not intended to replace advice given to you by  your health care provider. Make sure you discuss any questions you have with your health care provider. °  °Document Released: 07/25/2003 Document Revised: 08/12/2014 Document Reviewed: 10/06/2013 °Elsevier Interactive Patient Education ©2016 [employer] ° °

## 2015-08-24 NOTE — Progress Notes (Signed)
Breastfeeding tip of the week reviewed. Pt states she had Pap last year @ Dr. Elsie Stain office. Pt declines QUAD screen - did not have with other pregnancies either.

## 2015-08-24 NOTE — Progress Notes (Signed)
   Subjective:    Priscilla Sherman is a [id number] [redacted]w[redacted]d being seen today for her first obstetrical visit. Patient does intend to breast feed. Pregnancy history fully reviewed.  Patient reports no complaints.  Filed Vitals:   08/24/15 0833  BP: 106/56  Pulse: 71  Weight: 125 lb 4.8 oz (56.836 kg)    HISTORY: OB History  Gravida Para Term Preterm AB SAB TAB Ectopic Multiple Living  # Outcome Date GA Lbr Len/2nd Weight Sex Delivery Anes PTL Lv  4 Current           3 TAB           2 Para           1 Para              Past Medical History  Diagnosis Date  . Anxiety    Past Surgical History  Procedure Laterality Date  . No past surgeries     Family History  Problem Relation Age of Onset  . Cancer Father      Exam    Uterus:   gravid to umb  Pelvic Exam:    Perineum: No Hemorrhoids, Normal Perineum   Vulva: normal   Vagina:     pH:    Cervix: no cervical motion tenderness   Adnexa: normal adnexa and no mass, fullness, tenderness   Bony Pelvis: average  System: Breast:  n/a   Skin: normal coloration and turgor, no rashes    Neurologic: oriented, normal mood, grossly non-focal, CN II-XII intact   Extremities: normal strength, tone, and muscle mass   HEENT extra ocular movement intact, oropharynx clear, no lesions, neck supple with midline trachea and thyroid without masses   Mouth/Teeth mucous membranes moist, pharynx normal without lesions and dental hygiene good   Neck supple and no masses   Cardiovascular: regular rate and rhythm   Respiratory:  appears well, vitals normal, no respiratory distress, acyanotic, normal RR, ear and throat exam is normal, neck free of mass or lymphadenopathy, chest clear, no wheezing, crepitations, rhonchi, normal symmetric air entry   Abdomen: soft, non-tender; bowel sounds normal; no masses,  no organomegaly   Urinary: urethral meatus normal      Assessment:    Pregnancy: [id number] Patient Active Problem List   Diagnosis Date Noted  . Supervision of normal pregnancy 08/24/2015        Plan:     Initial labs drawn. Prenatal vitamins. Problem list reviewed and updated. Genetic Screening discussed Quad Screen: declined.  Ultrasound discussed; fetal survey: ordered.  Follow up in 4 weeks. 90% of 45 min visit spent on counseling and coordination of care.     Duane Boston Clark 08/24/2015

## 2015-08-25 LAB — PRENATAL PROFILE (SOLSTAS)
ANTIBODY SCREEN: NEGATIVE
BASOS ABS: 0 10*3/uL (ref 0.0–0.1)
Basophils Relative: 0 % (ref 0–1)
EOS ABS: 0.2 10*3/uL (ref 0.0–0.7)
Eosinophils Relative: 2 % (ref 0–5)
HEMATOCRIT: 29.4 % — AB (ref 36.0–46.0)
HEMOGLOBIN: 10.3 g/dL — AB (ref 12.0–15.0)
HIV 1&2 Ab, 4th Generation: NONREACTIVE
Hepatitis B Surface Ag: NEGATIVE
LYMPHS ABS: 1.7 10*3/uL (ref 0.7–4.0)
Lymphocytes Relative: 19 % (ref 12–46)
MCH: 31 pg (ref 26.0–34.0)
MCHC: 35 g/dL (ref 30.0–36.0)
MCV: 88.6 fL (ref 78.0–100.0)
MPV: 8.8 fL (ref 8.6–12.4)
Monocytes Absolute: 0.5 10*3/uL (ref 0.1–1.0)
Monocytes Relative: 6 % (ref 3–12)
NEUTROS PCT: 73 % (ref 43–77)
Neutro Abs: 6.4 10*3/uL (ref 1.7–7.7)
PLATELETS: 308 10*3/uL (ref 150–400)
RBC: 3.32 MIL/uL — AB (ref 3.87–5.11)
RDW: 12.9 % (ref 11.5–15.5)
Rh Type: POSITIVE
Rubella: 2.85 Index — ABNORMAL HIGH (ref <0.90)
WBC: 8.7 10*3/uL (ref 4.0–10.5)

## 2015-08-25 LAB — CULTURE, OB URINE: Colony Count: >=100000

## 2015-08-25 NOTE — Addendum Note (Signed)
Addended by: Jill Side on: 08/25/2015 08:56 AM   Modules accepted: Orders

## 2015-08-27 LAB — COCAINE METABOLITE (GC/LC/MS), URINE: BENZOYLECGONINE GC/MS CONF: 140 ng/mL — AB (ref <100)

## 2015-08-27 LAB — CANNABANOIDS (GC/LC/MS), URINE: THC-COOH (GC/LC/MS), ur confirm: 56 ng/mL — AB (ref <5)

## 2015-08-29 ENCOUNTER — Encounter: Payer: Self-pay | Admitting: Physician Assistant

## 2015-08-29 DIAGNOSIS — O99322 Drug use complicating pregnancy, second trimester: Secondary | ICD-10-CM | POA: Insufficient documentation

## 2015-08-29 LAB — PRESCRIPTION MONITORING PROFILE (19 PANEL)
AMPHETAMINE/METH: NEGATIVE ng/mL
BARBITURATE SCREEN, URINE: NEGATIVE ng/mL
BUPRENORPHINE, URINE: NEGATIVE ng/mL
Benzodiazepine Screen, Urine: NEGATIVE ng/mL
Carisoprodol, Urine: NEGATIVE ng/mL
Creatinine, Urine: 192.58 mg/dL (ref >20.0)
Fentanyl, Ur: NEGATIVE ng/mL
MDMA URINE: NEGATIVE ng/mL
MEPERIDINE UR: NEGATIVE ng/mL
METHADONE SCREEN, URINE: NEGATIVE ng/mL
Methaqualone: NEGATIVE ng/mL
NITRITES URINE, INITIAL: NEGATIVE ug/mL
OPIATE SCREEN, URINE: NEGATIVE ng/mL
Oxycodone Screen, Ur: NEGATIVE ng/mL
PROPOXYPHENE: NEGATIVE ng/mL
Phencyclidine, Ur: NEGATIVE ng/mL
TAPENTADOLUR: NEGATIVE ng/mL
Tramadol Scrn, Ur: NEGATIVE ng/mL
Zolpidem, Urine: NEGATIVE ng/mL
pH, Initial: 5.8 pH (ref 4.5–8.9)

## 2015-08-30 ENCOUNTER — Other Ambulatory Visit: Payer: Self-pay | Admitting: Physician Assistant

## 2015-08-30 DIAGNOSIS — Z3A18 18 weeks gestation of pregnancy: Secondary | ICD-10-CM

## 2015-08-30 DIAGNOSIS — Z3689 Encounter for other specified antenatal screening: Secondary | ICD-10-CM

## 2015-08-30 DIAGNOSIS — O99320 Drug use complicating pregnancy, unspecified trimester: Secondary | ICD-10-CM

## 2015-08-31 ENCOUNTER — Encounter (HOSPITAL_COMMUNITY): Payer: Self-pay

## 2015-08-31 ENCOUNTER — Ambulatory Visit (HOSPITAL_COMMUNITY)
Admission: RE | Admit: 2015-08-31 | Discharge: 2015-08-31 | Disposition: A | Discharge status: Home / Self Care | Payer: Medicaid Other | Source: Ambulatory Visit | Attending: Physician Assistant | Admitting: Physician Assistant

## 2015-08-31 DIAGNOSIS — Z36 Encounter for antenatal screening of mother: Secondary | ICD-10-CM | POA: Diagnosis not present

## 2015-08-31 DIAGNOSIS — O99322 Drug use complicating pregnancy, second trimester: Secondary | ICD-10-CM | POA: Insufficient documentation

## 2015-08-31 DIAGNOSIS — Z3492 Encounter for supervision of normal pregnancy, unspecified, second trimester: Secondary | ICD-10-CM

## 2015-08-31 DIAGNOSIS — O99320 Drug use complicating pregnancy, unspecified trimester: Secondary | ICD-10-CM

## 2015-08-31 DIAGNOSIS — Z3A17 17 weeks gestation of pregnancy: Secondary | ICD-10-CM | POA: Insufficient documentation

## 2015-08-31 DIAGNOSIS — Z3689 Encounter for other specified antenatal screening: Secondary | ICD-10-CM

## 2015-08-31 DIAGNOSIS — Z3A18 18 weeks gestation of pregnancy: Secondary | ICD-10-CM

## 2015-09-20 ENCOUNTER — Encounter: Payer: Medicaid Other | Admitting: Family

## 2015-09-27 ENCOUNTER — Encounter: Payer: Self-pay | Admitting: Obstetrics and Gynecology

## 2015-09-27 ENCOUNTER — Ambulatory Visit (INDEPENDENT_AMBULATORY_CARE_PROVIDER_SITE_OTHER): Payer: Medicaid Other | Admitting: Obstetrics and Gynecology

## 2015-09-27 VITALS — BP 98/56 | HR 72 | Temp 98.8°F | Wt 128.0 lb

## 2015-09-27 DIAGNOSIS — Z3492 Encounter for supervision of normal pregnancy, unspecified, second trimester: Secondary | ICD-10-CM

## 2015-09-27 LAB — POCT URINALYSIS DIP (DEVICE)
BILIRUBIN URINE: NEGATIVE
GLUCOSE, UA: NEGATIVE mg/dL
HGB URINE DIPSTICK: NEGATIVE
Ketones, ur: NEGATIVE mg/dL
LEUKOCYTES UA: NEGATIVE
NITRITE: NEGATIVE
Protein, ur: NEGATIVE mg/dL
Specific Gravity, Urine: 1.025 (ref 1.005–1.030)
UROBILINOGEN UA: 1 mg/dL (ref 0.0–1.0)
pH: 7.5 (ref 5.0–8.0)

## 2015-09-27 NOTE — Progress Notes (Signed)
Subjective:  Priscilla Sherman is a 35 y.o. [id number] at [redacted]w[redacted]d being seen today for ongoing prenatal care.  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 Hx LGA 9#11 with P1, no SD, no GDM   Patient reports no complaints.  Contractions: Not present. Vag. Bleeding: None.  Movement: Present. Denies leaking of fluid.   The following portions of the patient's history were reviewed and updated as appropriate: allergies, current medications, past family history, past medical history, past social history, past surgical history and problem list. Problem list updated.  Objective:   Filed Vitals:   09/27/15 1001  BP: 98/56  Pulse: 72  Temp: 98.8 F (37.1 C)  Weight: 128 lb (58.06 kg)    Fetal Status: Fetal Heart Rate (bpm): 150   Movement: Present     General:  Alert, oriented and cooperative. Patient is in no acute distress.  Skin: Skin is warm and dry. No rash noted.   Cardiovascular: Normal heart rate noted  Respiratory: Normal respiratory effort, no problems with respiration noted  Abdomen: Soft, gravid, appropriate for gestational age. Pain/Pressure: Present     Pelvic: Vag. Bleeding: None     Cervical exam deferred        Extremities: Normal range of motion.  Edema: None  Mental Status: Normal mood and affect. Normal behavior. Normal judgment and thought content.   Urinalysis: Urine Protein: Negative Urine Glucose: Negative  Assessment and Plan:  Pregnancy: U1L2440 at 100w5d  There are no diagnoses linked to this encounter. Preterm labor symptoms and general obstetric precautions including but not limited to vaginal bleeding, contractions, leaking of fluid and fetal movement were reviewed in detail with the patient. Please refer to After Visit Summary for other counseling recommendations.  Return in about 1 month (around 10/25/2015). Counseled  re: drug use, adverse effects on fetus. Repeat UDS next visit [place] to F/U anatomy scheduled Will schedule for early GCT (Hx LGA and AA race) as she cannot stay today due to work  Lynea Rollison Colin Mulders, CNM

## 2015-09-28 ENCOUNTER — Other Ambulatory Visit: Payer: Medicaid Other

## 2015-10-16 LAB — PROCEDURE REPORT - SCANNED: Pap: NEGATIVE

## 2015-10-25 ENCOUNTER — Encounter: Payer: Medicaid Other | Admitting: Family Medicine

## 2016-01-08 LAB — OB RESULTS CONSOLE GBS: GBS: POSITIVE

## 2016-01-15 ENCOUNTER — Other Ambulatory Visit: Payer: Self-pay | Admitting: Obstetrics

## 2016-01-29 ENCOUNTER — Inpatient Hospital Stay (HOSPITAL_COMMUNITY): Payer: Medicaid Other | Admitting: Anesthesiology

## 2016-01-29 ENCOUNTER — Encounter (HOSPITAL_COMMUNITY): Payer: Self-pay | Admitting: *Deleted

## 2016-01-29 ENCOUNTER — Telehealth (HOSPITAL_COMMUNITY): Payer: Self-pay | Admitting: General Practice

## 2016-01-29 ENCOUNTER — Inpatient Hospital Stay (HOSPITAL_COMMUNITY)
Admission: AD | Admit: 2016-01-29 | Discharge: 2016-01-30 | DRG: 775 | Disposition: A | Discharge status: Home / Self Care | Payer: Medicaid Other | Source: Ambulatory Visit | Attending: Obstetrics | Admitting: Obstetrics

## 2016-01-29 DIAGNOSIS — Z3A39 39 weeks gestation of pregnancy: Secondary | ICD-10-CM | POA: Diagnosis not present

## 2016-01-29 DIAGNOSIS — O99334 Smoking (tobacco) complicating childbirth: Secondary | ICD-10-CM | POA: Diagnosis present

## 2016-01-29 DIAGNOSIS — O4202 Full-term premature rupture of membranes, onset of labor within 24 hours of rupture: Secondary | ICD-10-CM | POA: Diagnosis present

## 2016-01-29 LAB — CBC
HCT: 33 % — ABNORMAL LOW (ref 36.0–46.0)
HEMOGLOBIN: 11.4 g/dL — AB (ref 12.0–15.0)
MCH: 31.6 pg (ref 26.0–34.0)
MCHC: 34.5 g/dL (ref 30.0–36.0)
MCV: 91.4 fL (ref 78.0–100.0)
Platelets: 333 10*3/uL (ref 150–400)
RBC: 3.61 MIL/uL — AB (ref 3.87–5.11)
RDW: 14.8 % (ref 11.5–15.5)
WBC: 19.5 10*3/uL — ABNORMAL HIGH (ref 4.0–10.5)

## 2016-01-29 LAB — RPR: RPR: NONREACTIVE

## 2016-01-29 LAB — TYPE AND SCREEN
ABO/RH(D): B POS
Antibody Screen: NEGATIVE

## 2016-01-29 MED ORDER — SENNOSIDES-DOCUSATE SODIUM 8.6-50 MG PO TABS
2.0000 | ORAL_TABLET | ORAL | Status: DC
  Administered 2016-01-30: 2 via ORAL
  Filled 2016-01-29: qty 2

## 2016-01-29 MED ORDER — OXYTOCIN BOLUS FROM INFUSION: 500.0000 mL | INTRAVENOUS | Status: DC

## 2016-01-29 MED ORDER — TETANUS-DIPHTH-ACELL PERTUSSIS 5-2.5-18.5 LF-MCG/0.5 IM SUSP: 0.5000 mL | Freq: Once | INTRAMUSCULAR | Status: DC

## 2016-01-29 MED ORDER — LIDOCAINE-EPINEPHRINE (PF) 2 %-1:200000 IJ SOLN
INTRAMUSCULAR | Status: DC | PRN
  Administered 2016-01-29: 3 mL

## 2016-01-29 MED ORDER — LIDOCAINE HCL (PF) 1 % IJ SOLN
30.0000 mL | INTRAMUSCULAR | Status: DC | PRN
  Filled 2016-01-29: qty 30

## 2016-01-29 MED ORDER — ZOLPIDEM TARTRATE 5 MG PO TABS: 5.0000 mg | ORAL_TABLET | Freq: Every evening | ORAL | Status: DC | PRN

## 2016-01-29 MED ORDER — DIBUCAINE 1 % RE OINT: 1.0000 "application " | TOPICAL_OINTMENT | RECTAL | Status: DC | PRN

## 2016-01-29 MED ORDER — ONDANSETRON HCL 4 MG/2ML IJ SOLN
4.0000 mg | INTRAMUSCULAR | Status: DC | PRN
Start: 2016-01-29 — End: 2016-01-30

## 2016-01-29 MED ORDER — DIPHENHYDRAMINE HCL 50 MG/ML IJ SOLN: 12.5000 mg | INTRAMUSCULAR | Status: DC | PRN

## 2016-01-29 MED ORDER — PHENYLEPHRINE 40 MCG/ML (10ML) SYRINGE FOR IV PUSH (FOR BLOOD PRESSURE SUPPORT)
80.0000 ug | PREFILLED_SYRINGE | INTRAVENOUS | Status: DC | PRN
  Filled 2016-01-29: qty 10
  Filled 2016-01-29: qty 5

## 2016-01-29 MED ORDER — ONDANSETRON HCL 4 MG/2ML IJ SOLN
4.0000 mg | Freq: Four times a day (QID) | INTRAMUSCULAR | Status: DC | PRN
  Administered 2016-01-29: 4 mg via INTRAVENOUS
  Filled 2016-01-29: qty 2

## 2016-01-29 MED ORDER — ACETAMINOPHEN 325 MG PO TABS
650.0000 mg | ORAL_TABLET | ORAL | Status: DC | PRN
  Administered 2016-01-30 (×2): 650 mg via ORAL
  Filled 2016-01-29 (×2): qty 2

## 2016-01-29 MED ORDER — EPHEDRINE 5 MG/ML INJ
10.0000 mg | INTRAVENOUS | Status: DC | PRN
  Filled 2016-01-29: qty 2

## 2016-01-29 MED ORDER — OXYTOCIN 40 UNITS IN LACTATED RINGERS INFUSION - SIMPLE MED
1.0000 m[IU]/min | INTRAVENOUS | Status: DC
  Administered 2016-01-29: 2 m[IU]/min via INTRAVENOUS

## 2016-01-29 MED ORDER — PENICILLIN G POTASSIUM 5000000 UNITS IJ SOLR
5.0000 10*6.[IU] | Freq: Once | INTRAVENOUS | Status: AC
  Administered 2016-01-29: 5 10*6.[IU] via INTRAVENOUS
  Filled 2016-01-29: qty 5

## 2016-01-29 MED ORDER — LACTATED RINGERS IV SOLN
500.0000 mL | Freq: Once | INTRAVENOUS | Status: DC
Start: 2016-01-29 — End: 2016-01-29

## 2016-01-29 MED ORDER — WITCH HAZEL-GLYCERIN EX PADS: 1.0000 "application " | MEDICATED_PAD | CUTANEOUS | Status: DC | PRN

## 2016-01-29 MED ORDER — DIPHENHYDRAMINE HCL 25 MG PO CAPS: 25.0000 mg | ORAL_CAPSULE | Freq: Four times a day (QID) | ORAL | Status: DC | PRN

## 2016-01-29 MED ORDER — ONDANSETRON HCL 4 MG PO TABS: 4.0000 mg | ORAL_TABLET | ORAL | Status: DC | PRN

## 2016-01-29 MED ORDER — FERROUS SULFATE 325 (65 FE) MG PO TABS
325.0000 mg | ORAL_TABLET | Freq: Two times a day (BID) | ORAL | Status: DC
  Administered 2016-01-30: 325 mg via ORAL
  Filled 2016-01-29: qty 1

## 2016-01-29 MED ORDER — OXYCODONE-ACETAMINOPHEN 5-325 MG PO TABS: 2.0000 | ORAL_TABLET | ORAL | Status: DC | PRN

## 2016-01-29 MED ORDER — TERBUTALINE SULFATE 1 MG/ML IJ SOLN
0.2500 mg | Freq: Once | INTRAMUSCULAR | Status: DC | PRN
  Filled 2016-01-29: qty 1

## 2016-01-29 MED ORDER — PHENYLEPHRINE 40 MCG/ML (10ML) SYRINGE FOR IV PUSH (FOR BLOOD PRESSURE SUPPORT)
80.0000 ug | PREFILLED_SYRINGE | INTRAVENOUS | Status: DC | PRN
  Filled 2016-01-29: qty 5

## 2016-01-29 MED ORDER — PENICILLIN G POTASSIUM 5000000 UNITS IJ SOLR
2.5000 10*6.[IU] | INTRAVENOUS | Status: DC
  Administered 2016-01-29 (×2): 2.5 10*6.[IU] via INTRAVENOUS
  Filled 2016-01-29 (×4): qty 2.5

## 2016-01-29 MED ORDER — SOD CITRATE-CITRIC ACID 500-334 MG/5ML PO SOLN: 30.0000 mL | ORAL | Status: DC | PRN

## 2016-01-29 MED ORDER — LACTATED RINGERS IV SOLN
INTRAVENOUS | Status: DC
  Administered 2016-01-29 (×2): via INTRAVENOUS

## 2016-01-29 MED ORDER — LACTATED RINGERS IV SOLN: 500.0000 mL | INTRAVENOUS | Status: DC | PRN

## 2016-01-29 MED ORDER — COCONUT OIL OIL: 1.0000 "application " | TOPICAL_OIL | Status: DC | PRN

## 2016-01-29 MED ORDER — PRENATAL MULTIVITAMIN CH
1.0000 | ORAL_TABLET | Freq: Every day | ORAL | Status: DC
  Administered 2016-01-30: 1 via ORAL
  Filled 2016-01-29: qty 1

## 2016-01-29 MED ORDER — FENTANYL 2.5 MCG/ML BUPIVACAINE 1/10 % EPIDURAL INFUSION (WH - ANES)
14.0000 mL/h | INTRAMUSCULAR | Status: DC | PRN
  Administered 2016-01-29 (×3): 14 mL/h via EPIDURAL
  Filled 2016-01-29 (×2): qty 125

## 2016-01-29 MED ORDER — ACETAMINOPHEN 325 MG PO TABS: 650.0000 mg | ORAL_TABLET | ORAL | Status: DC | PRN

## 2016-01-29 MED ORDER — OXYTOCIN 40 UNITS IN LACTATED RINGERS INFUSION - SIMPLE MED
2.5000 [IU]/h | INTRAVENOUS | Status: DC
  Filled 2016-01-29: qty 1000

## 2016-01-29 MED ORDER — IBUPROFEN 600 MG PO TABS
600.0000 mg | ORAL_TABLET | Freq: Four times a day (QID) | ORAL | Status: DC
  Administered 2016-01-29 – 2016-01-30 (×4): 600 mg via ORAL
  Filled 2016-01-29 (×4): qty 1

## 2016-01-29 MED ORDER — BUPIVACAINE HCL (PF) 0.25 % IJ SOLN
INTRAMUSCULAR | Status: DC | PRN
  Administered 2016-01-29 (×2): 4 mL via EPIDURAL

## 2016-01-29 MED ORDER — OXYCODONE-ACETAMINOPHEN 5-325 MG PO TABS: 1.0000 | ORAL_TABLET | ORAL | Status: DC | PRN

## 2016-01-29 MED ORDER — SIMETHICONE 80 MG PO CHEW: 80.0000 mg | CHEWABLE_TABLET | ORAL | Status: DC | PRN

## 2016-01-29 MED ORDER — BENZOCAINE-MENTHOL 20-0.5 % EX AERO: 1.0000 "application " | INHALATION_SPRAY | CUTANEOUS | Status: DC | PRN

## 2016-01-29 NOTE — MAU Note (Signed)
Urine in lab 

## 2016-01-29 NOTE — Anesthesia Procedure Notes (Signed)
Epidural Patient location during procedure: OB  Staffing Anesthesiologist: Andris Brothers Performed by: anesthesiologist   Preanesthetic Checklist Completed: patient identified, surgical consent, pre-op evaluation, timeout performed, IV checked, risks and benefits discussed and monitors and equipment checked  Epidural Patient position: sitting Prep: DuraPrep Patient monitoring: heart rate, cardiac monitor, continuous pulse ox and blood pressure Approach: midline Location: L3-L4 Injection technique: LOR saline  Needle:  Needle type: Tuohy  Needle gauge: 17 G Needle length: 9 cm Needle insertion depth: 6 cm Catheter type: closed end flexible Catheter size: 19 Gauge Catheter at skin depth: 12 cm Test dose: negative and 2% lidocaine with Epi 1:200 K  Assessment Events: blood not aspirated, injection not painful, no injection resistance, negative IV test and no paresthesia  Additional Notes Reason for block:procedure for pain   

## 2016-01-29 NOTE — H&P (Signed)
This is Dr. Francoise CeoBernard Marshall dictating the history and physical on  Priscilla Sherman  she is a 35 year old gravida 4 para 2012 membranes ruptured spontaneously at 7 AM today EDC 01/16/2016 positive GBS she got penicillin she's in labor she is no 4 cm 80% vertex -1 and she has an epidural Past medical history negative Past surgical history negative Social history negative System review negative Physical exam well-developed female in labor HEENT negative Lungs clear. Clear to P&A Breasts negative Abdomen term Pelvic as described above Extremities negative

## 2016-01-29 NOTE — Anesthesia Preprocedure Evaluation (Signed)
Anesthesia Evaluation  Patient identified by MRN, date of birth, ID band Patient awake    Reviewed: Allergy & Precautions, Patient's Chart, lab work & pertinent test results  History of Anesthesia Complications Negative for: history of anesthetic complications  Airway Mallampati: II  TM Distance: >3 FB Neck ROM: Full    Dental  (+) Teeth Intact   Pulmonary neg pulmonary ROS, Current Smoker,    breath sounds clear to auscultation       Cardiovascular negative cardio ROS   Rhythm:Regular     Neuro/Psych negative neurological ROS  negative psych ROS   GI/Hepatic negative GI ROS, Neg liver ROS,   Endo/Other  negative endocrine ROS  Renal/GU negative Renal ROS     Musculoskeletal   Abdominal   Peds  Hematology negative hematology ROS (+)   Anesthesia Other Findings   Reproductive/Obstetrics (+) Pregnancy                             Anesthesia Physical Anesthesia Plan  ASA: II  Anesthesia Plan: Epidural   Post-op Pain Management:    Induction:   Airway Management Planned:   Additional Equipment:   Intra-op Plan:   Post-operative Plan:   Informed Consent: I have reviewed the patients History and Physical, chart, labs and discussed the procedure including the risks, benefits and alternatives for the proposed anesthesia with the patient or authorized representative who has indicated his/her understanding and acceptance.   Dental advisory given  Plan Discussed with: Anesthesiologist  Anesthesia Plan Comments:         Anesthesia Quick Evaluation

## 2016-01-29 NOTE — MAU Provider Note (Signed)
Ms. Frederich Charin J Kesselman is a 35 y.o. [phone] at 2960w3d  who presents to MAU today complaining of LOF and contractions since yesterday. The patient denies vaginal bleeding. She reports good fetal movement.    BP 115/60 mmHg  Pulse 82  Temp(Src) 98.8 F (37.1 C)  Resp 16  Wt 140 lb 6.4 oz (63.685 kg)  LMP 04/20/2015 (Approximate) GENERAL: Well-developed, well-nourished female in no acute distress.  HEAD: Normocephalic, atraumatic.  CHEST: Normal effort of breathing, regular heart rate ABDOMEN: Soft, nontender, nondistended.  PELVIC: Normal external female genitalia. Vagina is pink and rugated.  Normal discharge.  + pooling. Grossly ruptured. Gravid uterus.   EXTREMITIES: No cyanosis, clubbing, or edema Dilation: 2.5 Effacement (%): 80 Station: -3 Presentation: Vertex Exam by:: Ginger Morris RN   Fetal Monitoring:  Baseline: 130 bpm, moderate variability, + accelerations, no decelerations Contractions: q 2-4 minutes  A: SIUP at 5760w3d SROM Uterine contractions   P: Report given to RN to contact MD on call for further instructions  Marny LowensteinJulie N Wenzel, PA-C 01/29/2016 7:50 AM

## 2016-01-29 NOTE — MAU Note (Signed)
Pt presents to MAU with complaints of LOF that started on the way to the hospital. Reports contractions since 0200am.

## 2016-01-29 NOTE — Progress Notes (Signed)
Dr Gaynell FaceMarshall notified of pt's VE, ROM contraction pattern, orders received

## 2016-01-30 ENCOUNTER — Inpatient Hospital Stay (HOSPITAL_COMMUNITY): Payer: Medicaid Other

## 2016-01-30 ENCOUNTER — Inpatient Hospital Stay (HOSPITAL_COMMUNITY): Admission: RE | Admit: 2016-01-30 | Payer: Medicaid Other | Source: Ambulatory Visit

## 2016-01-30 LAB — CBC
HCT: 28.8 % — ABNORMAL LOW (ref 36.0–46.0)
HEMOGLOBIN: 10 g/dL — AB (ref 12.0–15.0)
MCH: 31.6 pg (ref 26.0–34.0)
MCHC: 34.7 g/dL (ref 30.0–36.0)
MCV: 91.1 fL (ref 78.0–100.0)
PLATELETS: 270 10*3/uL (ref 150–400)
RBC: 3.16 MIL/uL — AB (ref 3.87–5.11)
RDW: 14.9 % (ref 11.5–15.5)
WBC: 17.8 10*3/uL — ABNORMAL HIGH (ref 4.0–10.5)

## 2016-01-30 NOTE — Anesthesia Postprocedure Evaluation (Signed)
Anesthesia Post Note  Patient: Frederich Charin J Bray  Procedure(s) Performed: * No procedures listed *  Patient location during evaluation: Mother Baby Anesthesia Type: Epidural Level of consciousness: awake, awake and alert and oriented Pain management: pain level controlled Vital Signs Assessment: post-procedure vital signs reviewed and stable Respiratory status: spontaneous breathing, nonlabored ventilation and respiratory function stable Cardiovascular status: stable Postop Assessment: no headache, no backache, patient able to bend at knees, no signs of nausea or vomiting and adequate PO intake Anesthetic complications: no     Last Vitals:  Filed Vitals:   01/30/16 0033 01/30/16 0528  BP: 117/65 109/70  Pulse: 64 66  Temp: 36.3 C 36.8 C  Resp: 20 16    Last Pain:  Filed Vitals:   01/30/16 0952  PainSc: 4    Pain Goal: Patients Stated Pain Goal: 3 (01/29/16 1502)               [place] Gamero

## 2016-01-30 NOTE — Progress Notes (Signed)
Patient ID: Priscilla Sherman, female   DOB: October 30, 1980, 35 y.o.   MRN: [id number] Postpartum day one Blood pressure 109/70 pulse 66 respiration 16 Fundus firm Lochia moderate patient wants early discharge

## 2016-01-30 NOTE — Discharge Summary (Signed)
Obstetric Discharge Summary Reason for Admission: onset of labor Prenatal Procedures: none Intrapartum Procedures: spontaneous vaginal delivery Postpartum Procedures: none Complications-Operative and Postpartum: none HEMOGLOBIN  Date Value Ref Range Status  01/30/2016 10.0* 12.0 - 15.0 g/dL Final   HCT  Date Value Ref Range Status  01/30/2016 28.8* 36.0 - 46.0 % Final    Physical Exam:  General: alert Lochia: appropriate Uterine Fundus: firm Incision: healing well DVT Evaluation: No evidence of DVT seen on physical exam.  Discharge Diagnoses: Term Pregnancy-delivered  Discharge Information: Date: 01/30/2016 Activity: pelvic rest Diet: routine Medications: Tylenol #3 Condition: improved Instructions: refer to practice specific booklet Discharge to: home Follow-up Information    Follow up with HARPER,CHARLES A, MD.   Specialty:  Obstetrics and Gynecology   Contact information:   [address] [place] [address] [phone]       Newborn Data: Live born female  Birth Weight: 6 lb 13.4 oz (3100 g) APGAR: 8, 9  Home with mother.  MARSHALL,BERNARD A 01/30/2016, 6:41 AM

## 2016-01-30 NOTE — Discharge Instructions (Signed)
Discharge instructions ° °· You can wash your hair °· Shower °· Eat what you want °· Drink what you want °· See me in 6 weeks °· Your ankles are going to swell more in the next 2 weeks than when pregnant °· No sex for 6 weeks ° ° °Zebedee Segundo A, MD 01/30/2016 ° ° °

## 2016-01-30 NOTE — Clinical Social Work Maternal (Signed)
  CLINICAL SOCIAL WORK MATERNAL/CHILD NOTE  Patient Details  Name: Priscilla Sherman MRN: [id number] Date of Birth: [birth year]  Date:  01/30/2016  Clinical Social Worker Initiating Note:  Priscilla Sherman Date/ Time Initiated:  01/30/16/1436     Child's Name:      Legal Guardian:  Mother   Need for Interpreter:  None, [place]   Date of Referral:  01/29/16     Reason for Referral:  Current Substance Use/Substance Use During Pregnancy , Behavioral Health Issues, including SI    Referral Source:  [facility]   Address:  [address]  Phone number:  [phone]   Household Members:  Self, Minor Children   Natural Supports (not living in the home):  Extended Family, Immediate Family, Friends, Parent   Professional Supports: Case Metallurgist Scientist, forensic)   Employment: Part-time   Type of Work: Scientist, water quality at [employer]   Education:  Chiropractor Resources:  [employer]   Other Resources:  [employer], Physicist, medical , [place] Considerations Which May Impact Care:  Per [employer] Face Sheet [name] is Priscilla Sherman  Strengths:  Ability to meet basic needs , Home prepared for child    Risk Factors/Current Problems:  Mental Health Concerns , Substance Use    Cognitive State:  Alert , Goal Oriented , Able to Concentrate    Mood/Affect:  Calm , Comfortable , Interested    CSW Assessment: CSW met with [name] for a consult for hx of THC, cocaine, and hx of anxiety. [name] requested [name]'s family to leave room in effort for CSW to meet with [name] in private.  [name] was initially irritated when CSW entered room and introduced herself.  However, after a few minutes [name] became interested with meeting with CSW as evidence by engaged conversation. CSW informed [name] of the hospital's drug screen policy, and informed [name] of the 2 screenings for the infant. [name] was understanding and admitted to utilizing marijuana until about 3 weeks ago.   [name] stated that she experienced nausea and had her appetite was limited; as a result she utilized marijuana to reduce the nausea and increase her appetite.  CSW thanked [name] for being honest, and informed [name] of the infant's UDS results.  [name] appeared shocked (as evidence by her facial expression) to hear that the infant had a positive screen for opiates. [name] denied all substances with the exception of marijuana. CSW communicated to [name] that medication during L&D could possibly be contributed to the infant's positive UDS.  However, CSW is awaiting results from cord screen before CSW contacts CPS.     CSW educated [name] about PPD.  CSW informed [name] of possible supports and interventions to decrease PPD.  CSW also encouraged [name] to seek medical attention if needed for increased signs and symptoms for PPD. [name] denied hx of anxiety, and communicated she had a traumatic experience that interrupted her sleep pattern for almost 3 months.  [name] acknowledged she was seen at [facility] for medication management for the trauma.   CSW Plan/Description:  Patient/Family Education , No Further Intervention Required/No Barriers to Discharge (CSW awaiting cord screen; report will be made CPS if positive.)    Priscilla D BOYD-GILYARD, LCSW 01/30/2016, 3:00 PM

## 2016-01-31 NOTE — Progress Notes (Addendum)
CSW (H. Coble) received a phone call from Peds. physician Priscilla Sherman regarding concerns about Priscilla Sherman's hx of substance usage. Physician expressed concerns about subsequent use in early pregnancy with cocaine, thus treatment team has decided to go forth with making [facility] [employer].   CSW (Phiona Ramnauth [place]) called CPS Priscilla Sherman(Priscilla Sherman) to make a report for a positive UDS for opiates( Priscilla Sherman has prescription for Tylenol 3),  Priscilla Sherman admitting to utilizing THC 3 weeks prior to giving birth, and UDS screen positive August 24, 2015, for cocaine. CPS accepted report, and CPS will follow up with Priscilla Sherman.  CSW will follow cord and will report outcomes when they are available.  Priscilla Sherman is aware of report due to the above concerns.

## 2016-02-07 NOTE — Progress Notes (Signed)
CSW monitored cord, and reported positive cord screen to CPS.

## 2016-04-29 ENCOUNTER — Encounter: Payer: Self-pay | Admitting: *Deleted

## 2016-06-04 ENCOUNTER — Ambulatory Visit (INDEPENDENT_AMBULATORY_CARE_PROVIDER_SITE_OTHER): Payer: Medicaid Other | Admitting: Obstetrics & Gynecology

## 2016-06-04 ENCOUNTER — Encounter: Payer: Self-pay | Admitting: Obstetrics & Gynecology

## 2016-06-04 VITALS — BP 124/87 | HR 68 | Temp 98.7°F | Ht 62.0 in | Wt 120.6 lb

## 2016-06-04 DIAGNOSIS — Z30013 Encounter for initial prescription of injectable contraceptive: Secondary | ICD-10-CM | POA: Diagnosis not present

## 2016-06-04 MED ORDER — MEDROXYPROGESTERONE ACETATE 150 MG/ML IM SUSP: 150.0000 mg | INTRAMUSCULAR | 0 refills | Status: DC

## 2016-06-04 NOTE — Progress Notes (Signed)
Patient ID: Priscilla Sherman, female   DOB: April 12, 1981, 35 y.o.   MRN: [id number]  Chief Complaint  Patient presents with  . New Patient    Patient is in the office for initial visit for birth control.  Wants depo provera  HPI Priscilla Sherman is a 35 y.o. female.  [id number] Patient's last menstrual period was 06/01/2016 (exact date). S/P SVD 4.5 mo ago with Dr Gaynell FaceMarshall, did not have PP visit. She wants to start DMPA. HPI  Past Medical History:  Diagnosis Date  . Anxiety     Past Surgical History:  Procedure Laterality Date  . NO PAST SURGERIES      Family History  Problem Relation Age of Onset  . Cancer Father     Social History Social History  Substance Use Topics  . Smoking status: Current Every Day Smoker    Packs/day: 0.10    Years: 10.00    Types: Cigarettes  . Smokeless tobacco: Not on file  . Alcohol use Yes     Comment: socially    No Known Allergies  Current Outpatient Prescriptions  Medication Sig Dispense Refill  . medroxyPROGESTERone (DEPO-PROVERA) 150 MG/ML injection Inject 1 mL (150 mg total) into the muscle every 3 (three) months. 1 mL 0   No current facility-administered medications for this visit.    OB History    Gravida Para Term Preterm AB Living   4 3 3   1 1    SAB TAB Ectopic Multiple Live Births     1   0 1    3 living   Review of Systems Review of Systems  Constitutional: Negative.   Gastrointestinal: Negative.   Genitourinary: Positive for vaginal bleeding (menses). Negative for menstrual problem and pelvic pain.    Blood pressure 124/87, pulse 68, temperature 98.7 F (37.1 C), temperature source Oral, height 5\' 2"  (1.575 m), weight 120 lb 9.6 oz (54.7 kg), last menstrual period 06/01/2016, not currently breastfeeding.  Physical Exam Physical Exam  Constitutional: She appears well-developed. No distress.  Pulmonary/Chest: Effort normal.  Psychiatric: She has a normal mood and affect. Her behavior is normal.  Vitals  reviewed.   Data Reviewed Delivery note  Assessment    Doing well 4 mo PP    Plan    Depo provera 150 mg IM Q3354mo       Tashan Kreitzer 06/04/2016, 3:10 PM

## 2016-06-04 NOTE — Progress Notes (Signed)
Patient is in the office for birth control and states that she wants to do the Depo injection.

## 2016-06-06 ENCOUNTER — Ambulatory Visit (INDEPENDENT_AMBULATORY_CARE_PROVIDER_SITE_OTHER): Payer: Medicaid Other | Admitting: *Deleted

## 2016-06-06 VITALS — BP 125/85 | HR 66 | Wt 118.0 lb

## 2016-06-06 DIAGNOSIS — Z3202 Encounter for pregnancy test, result negative: Secondary | ICD-10-CM

## 2016-06-06 DIAGNOSIS — Z30011 Encounter for initial prescription of contraceptive pills: Secondary | ICD-10-CM

## 2016-06-06 DIAGNOSIS — Z30013 Encounter for initial prescription of injectable contraceptive: Secondary | ICD-10-CM | POA: Diagnosis not present

## 2016-06-06 LAB — POCT URINE PREGNANCY: Preg Test, Ur: NEGATIVE

## 2016-06-06 MED ORDER — MEDROXYPROGESTERONE ACETATE 150 MG/ML IM SUSP
150.0000 mg | INTRAMUSCULAR | Status: AC
  Administered 2016-06-06: 150 mg via INTRAMUSCULAR

## 2016-06-06 NOTE — Progress Notes (Signed)
Patient states she has been sexually active since she delivered so UPT ordered today in office. LMP- 06/01/2016

## 2016-08-12 ENCOUNTER — Other Ambulatory Visit: Payer: Self-pay | Admitting: Obstetrics & Gynecology

## 2016-08-12 DIAGNOSIS — Z30013 Encounter for initial prescription of injectable contraceptive: Secondary | ICD-10-CM

## 2016-08-22 ENCOUNTER — Ambulatory Visit: Payer: Medicaid Other

## 2016-08-26 ENCOUNTER — Other Ambulatory Visit: Payer: Self-pay | Admitting: *Deleted

## 2016-08-26 DIAGNOSIS — Z30013 Encounter for initial prescription of injectable contraceptive: Secondary | ICD-10-CM

## 2016-08-26 MED ORDER — MEDROXYPROGESTERONE ACETATE 150 MG/ML IM SUSP: 150.0000 mg | INTRAMUSCULAR | 0 refills | Status: DC

## 2016-08-26 NOTE — Progress Notes (Signed)
Pt is due for depo.  Pt needed refill as she wasn't given refills at time of order.  1 refill sent to pharmacy, pt will need annual in order to continue refills.

## 2016-08-29 ENCOUNTER — Ambulatory Visit (INDEPENDENT_AMBULATORY_CARE_PROVIDER_SITE_OTHER): Payer: Medicaid Other

## 2016-08-29 VITALS — BP 116/75 | HR 82 | Temp 98.1°F

## 2016-08-29 DIAGNOSIS — Z308 Encounter for other contraceptive management: Secondary | ICD-10-CM

## 2016-08-29 DIAGNOSIS — Z3042 Encounter for surveillance of injectable contraceptive: Secondary | ICD-10-CM

## 2016-08-29 MED ORDER — MEDROXYPROGESTERONE ACETATE 150 MG/ML IM SUSP
150.0000 mg | Freq: Once | INTRAMUSCULAR | Status: AC
  Administered 2016-08-29: 150 mg via INTRAMUSCULAR

## 2016-08-29 NOTE — Progress Notes (Signed)
Patient in office for routine depo. Tolerated well, given in right harm.

## 2016-09-24 ENCOUNTER — Other Ambulatory Visit: Payer: Self-pay | Admitting: Physician Assistant

## 2016-09-24 DIAGNOSIS — Z3492 Encounter for supervision of normal pregnancy, unspecified, second trimester: Secondary | ICD-10-CM

## 2016-11-19 ENCOUNTER — Other Ambulatory Visit: Payer: Self-pay | Admitting: Obstetrics

## 2016-11-19 DIAGNOSIS — Z30013 Encounter for initial prescription of injectable contraceptive: Secondary | ICD-10-CM

## 2016-11-20 ENCOUNTER — Ambulatory Visit: Payer: Medicaid Other

## 2016-11-21 ENCOUNTER — Ambulatory Visit (INDEPENDENT_AMBULATORY_CARE_PROVIDER_SITE_OTHER): Payer: Medicaid Other | Admitting: *Deleted

## 2016-11-21 VITALS — BP 120/80 | HR 69 | Wt 118.4 lb

## 2016-11-21 DIAGNOSIS — Z3042 Encounter for surveillance of injectable contraceptive: Secondary | ICD-10-CM

## 2016-11-21 NOTE — Progress Notes (Signed)
Patient is in the office to get her Depo Provera .

## 2016-11-28 ENCOUNTER — Ambulatory Visit: Payer: Medicaid Other

## 2017-01-08 ENCOUNTER — Emergency Department (HOSPITAL_COMMUNITY)
Admission: EM | Admit: 2017-01-08 | Discharge: 2017-01-08 | Disposition: A | Discharge status: Home / Self Care | Payer: Medicaid Other | Attending: Emergency Medicine | Admitting: Emergency Medicine

## 2017-01-08 ENCOUNTER — Encounter (HOSPITAL_COMMUNITY): Payer: Self-pay | Admitting: *Deleted

## 2017-01-08 DIAGNOSIS — M542 Cervicalgia: Secondary | ICD-10-CM | POA: Diagnosis present

## 2017-01-08 DIAGNOSIS — Y939 Activity, unspecified: Secondary | ICD-10-CM | POA: Diagnosis not present

## 2017-01-08 DIAGNOSIS — Y999 Unspecified external cause status: Secondary | ICD-10-CM | POA: Insufficient documentation

## 2017-01-08 DIAGNOSIS — Y929 Unspecified place or not applicable: Secondary | ICD-10-CM | POA: Insufficient documentation

## 2017-01-08 IMAGING — US US MFM OB DETAIL+14 WK
1 series · 14 of 28 positions shown · non-contrast
Comparison: none

[Series 1: us mfm ob detail+14 wk · 14 of 82 slices shown]
[im 4/82]
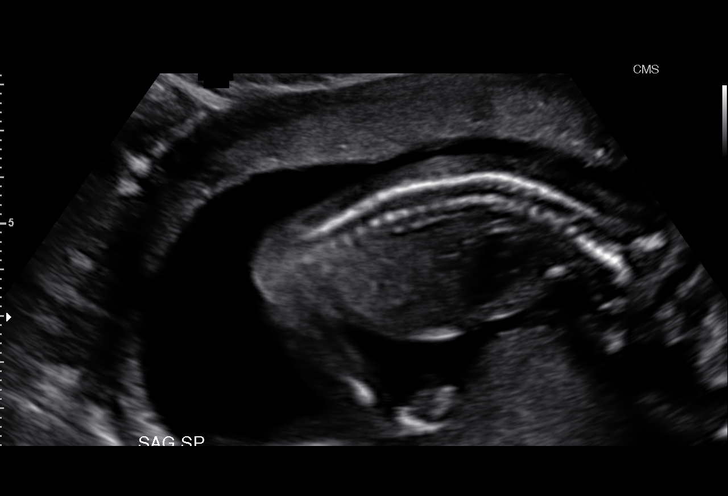
[im 10/82]
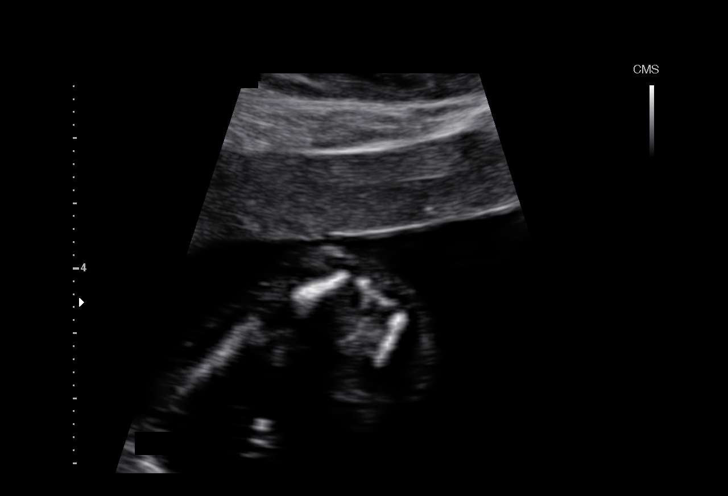
[im 16/82]
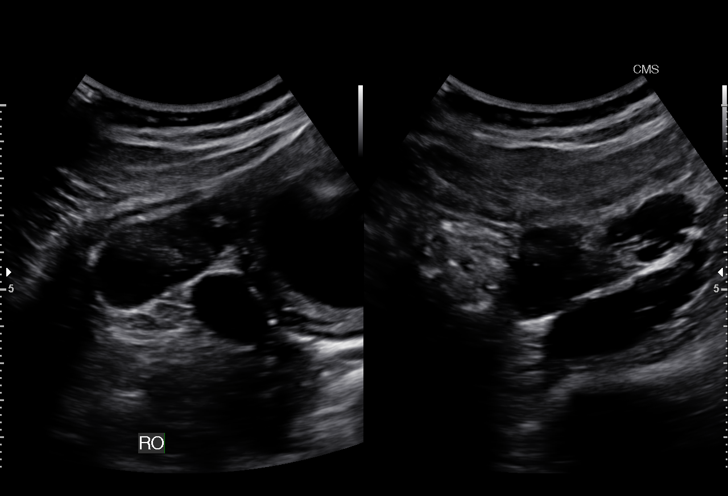
[im 22/82]
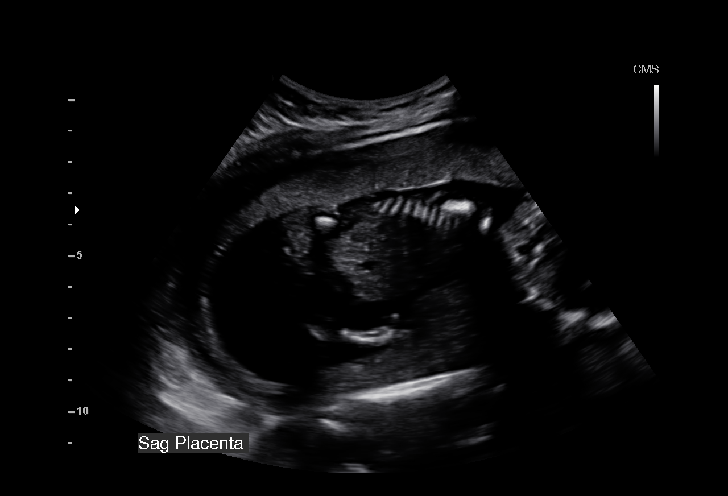
[im 28/82]
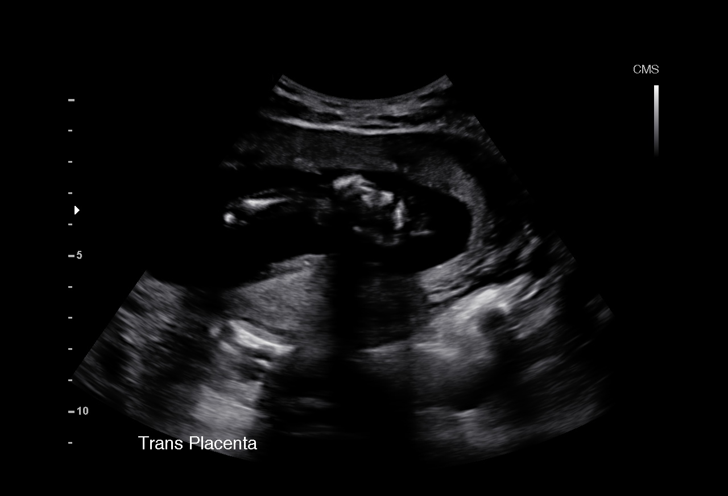
[im 34/82]
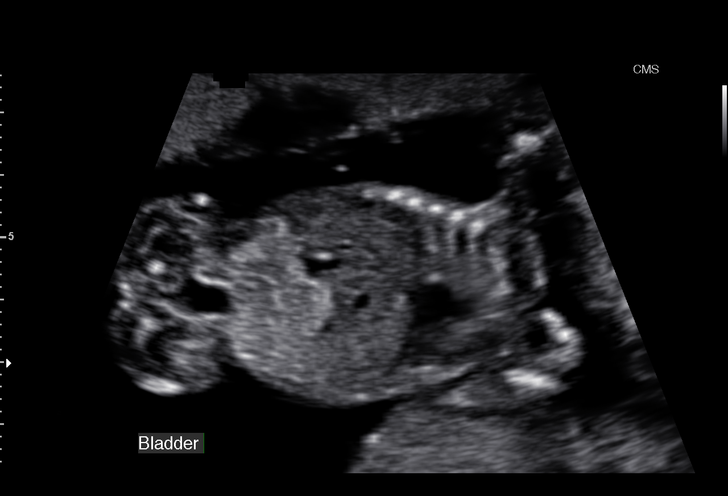
[im 40/82]
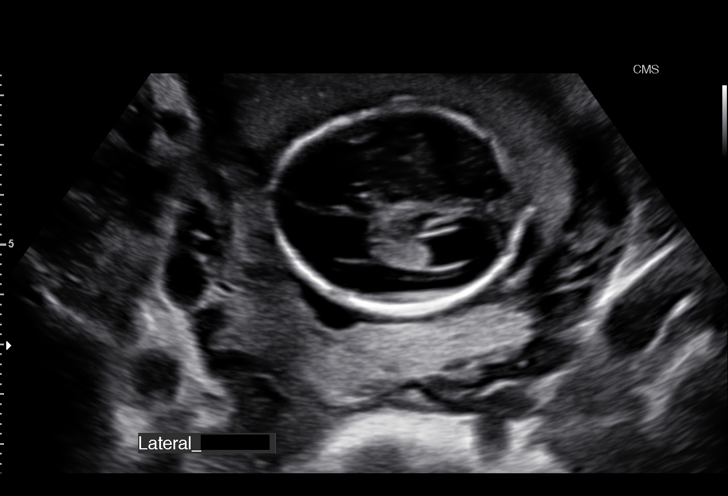
[im 46/82]
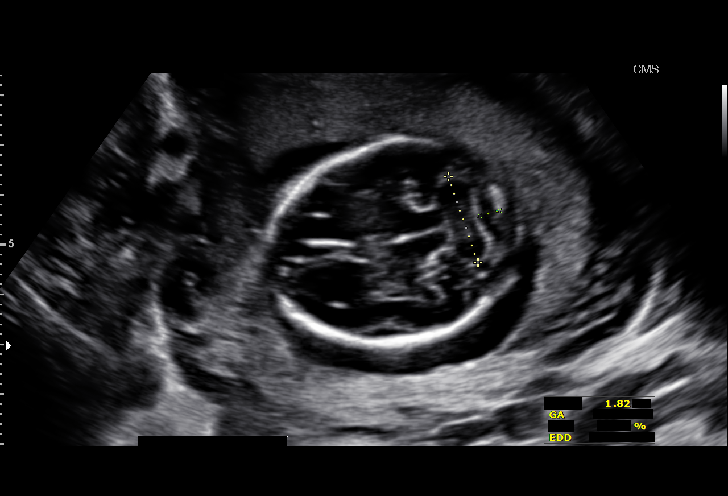
[im 52/82]
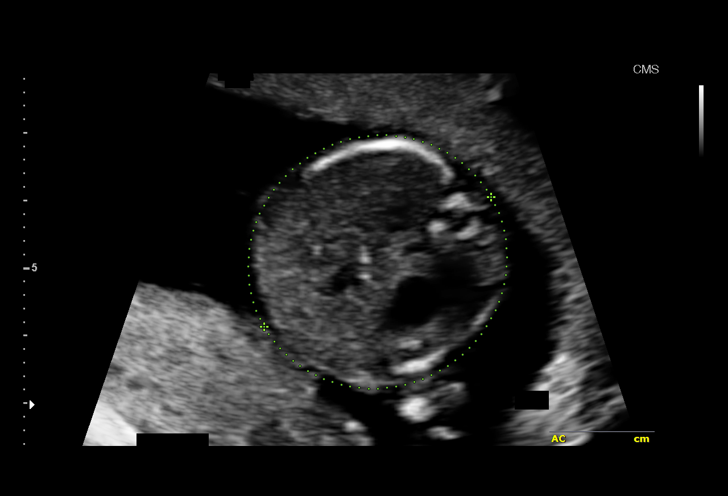
[im 58/82]
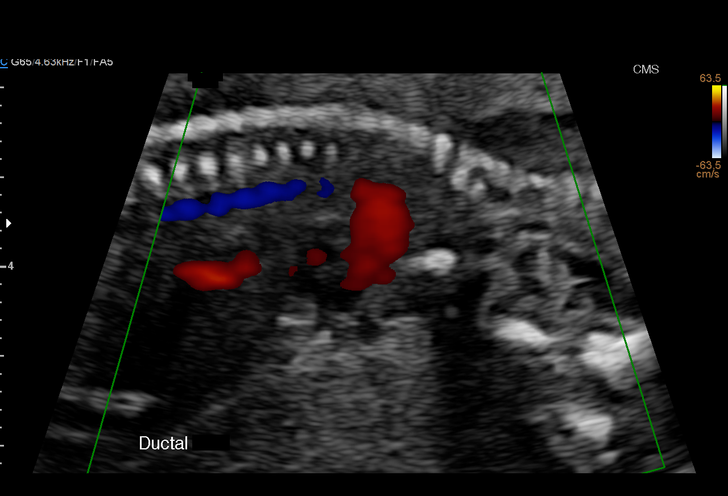
[im 64/82]
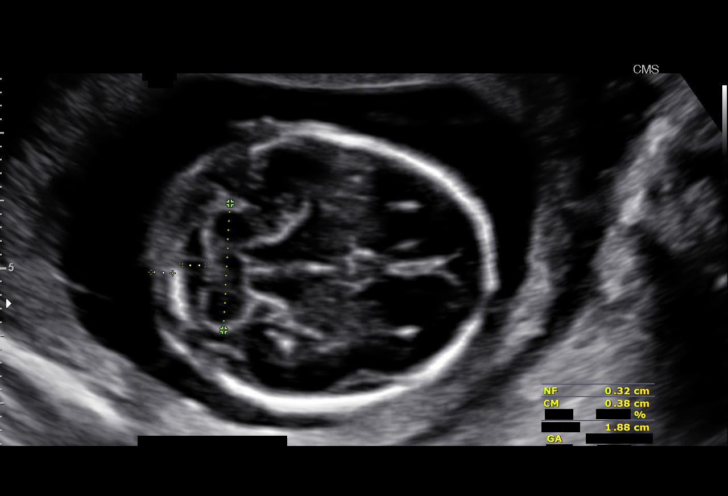
[im 70/82]
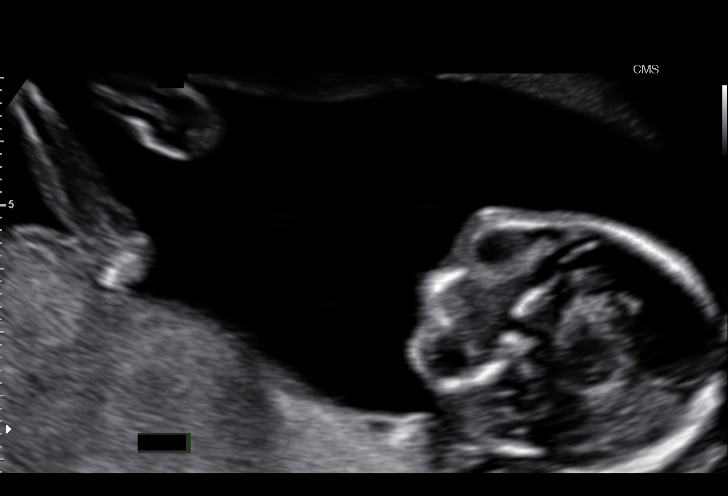
[im 76/82]
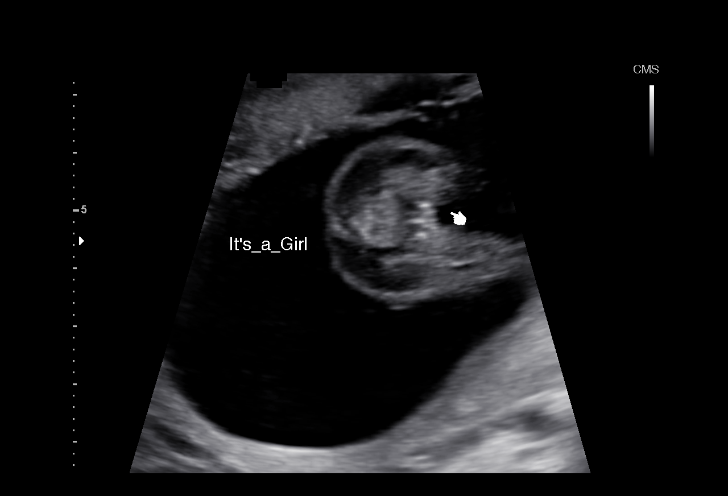
[im 82/82]
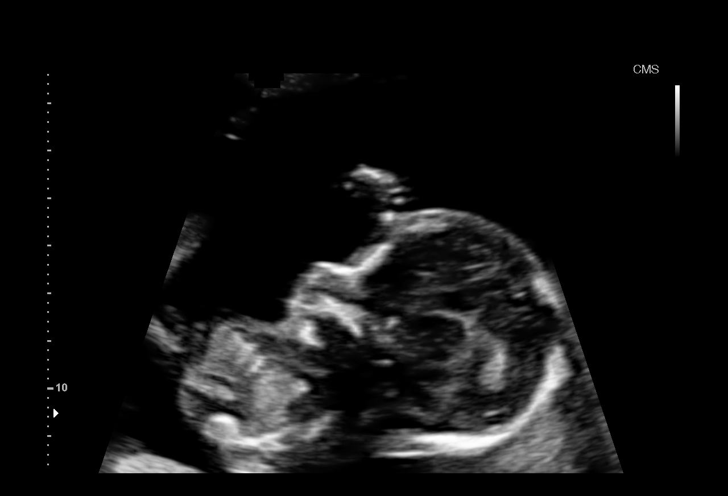

[14 of 28 positions shown; findings below may reference images not displayed]

pm)

Date:

LORRAINE JIM
NP

1  MASIEK LONT             [id number]       [id number]     [id number]
TUTA
Indications

17 weeks gestation of pregnancy
Detailed fetal anatomic survey                  Z36
Drug use complicating pregnancy, second
trimester (THC/cocaine on 08/24/15)
OB History

Gravidity:     4         Term:  2        Prem:    0        SAB:   0
TOP:           1       Ectopic  0        Living:  2
:
Fetal Evaluation

Num Of Fetuses:      1
Cardiac Activity:    Observed
Presentation:        Cephalic
Placenta:            Anterior, above cervical os
P. Cord Insertion:   Visualized, central

Amniotic Fluid
AFI FV:      Subjectively within normal limits
Larg Pckt:    5.11   cm
Biometry

BPD:      39.8  mm     G. Age:   18w 0d                  CI:        72.31   %    70 - 86
FL/HC:      16.7   %    15.8 - 18
HC:      148.9  mm     G. Age:   18w 0d        49   %    HC/AC:      1.26        1.07 -
AC:      118.3  mm     G. Age:   17w 4d        38   %    FL/BPD      62.3   %
:
FL:       24.8  mm     G. Age:   17w 4d        32   %    FL/AC:      21.0   %    20 - 24
HUM:      24.8  mm     G. Age:   17w 6d        52   %
CER:      18.5  mm     G. Age:   18w 2d        60   %
NFT:       3.2  mm
CM:        3.9  mm

Est.         201   gm    0 lb 7 oz      44   %
FW:
Gestational Age

LMP:           19w 0d        Date:  04/20/15                  EDD:   01/25/16
U/S Today:     17w 6d                                         EDD:   02/02/16
Best:          17w 6d    Det. By:   U/S (08/31/15)            EDD:   02/02/16
Anatomy

Cranium:          Appears normal         Aortic Arch:       Appears normal
Fetal Cavum:      Appears normal         Ductal Arch:       Appears normal
Ventricles:       Appears normal         Diaphragm:         Appears normal
Choroid Plexus:   Appears normal         Stomach:           Appears normal,
left sided
Cerebellum:       Appears normal         Abdomen:           Appears normal
Posterior         Appears normal         Abdominal          Appears nml (cord
Fossa:                                   Wall:              insert, abd wall)
Nuchal Fold:      Appears normal         Cord Vessels:      Appears normal (3
vessel cord)
Face:             Appears normal         Kidneys:           Appear normal
(orbits and profile)
Lips:             Appears normal         Bladder:           Appears normal
Heart:            Not well visualized    Spine:             Appears normal
RVOT:             Not well visualized    Upper              Appears normal
Extremities:
LVOT:             Appears normal         Lower              Appears normal
Extremities:

Other:   Fetus appears to be a female. Heels visualized. Right 5th digit
visualized.
Cervix Uterus Adnexa

Cervix
Length:           3.97   cm.
Normal appearance by transabdominal scan.

Uterus
No abnormality visualized.
Left Ovary
Within normal limits.

Right Ovary
Within normal limits.

Cul De        No free fluid seen.
Sac:

Adnexa:       No abnormality visualized.
Impression

Single IUP at 17w 6d
Limited views of the fetal heart were obtained
The remainder of the fetal anatomy appears normal
Anterior placenta without previa
Normal amniotic fluid volume
Recommendations

Recommend follow-up ultrasound examination in 4 weeks
to reevaluate the fetal heart

## 2017-01-08 NOTE — ED Notes (Signed)
ED Provider at bedside. 
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
## 2017-01-08 NOTE — Discharge Instructions (Signed)
Please read attached information. If you experience any new or worsening signs or symptoms please return to the emergency room for evaluation. Please follow-up with your primary care provider or specialist as discussed.  °

## 2017-03-05 ENCOUNTER — Other Ambulatory Visit: Payer: Self-pay | Admitting: Obstetrics

## 2017-03-05 DIAGNOSIS — Z30013 Encounter for initial prescription of injectable contraceptive: Secondary | ICD-10-CM
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
## 2017-08-26 ENCOUNTER — Encounter: Payer: Self-pay | Admitting: Obstetrics and Gynecology

## 2017-08-26 ENCOUNTER — Other Ambulatory Visit: Payer: Medicaid Other

## 2017-08-26 ENCOUNTER — Ambulatory Visit (INDEPENDENT_AMBULATORY_CARE_PROVIDER_SITE_OTHER): Payer: Medicaid Other | Admitting: Obstetrics and Gynecology

## 2017-08-26 ENCOUNTER — Other Ambulatory Visit (HOSPITAL_COMMUNITY)
Admission: RE | Admit: 2017-08-26 | Discharge: 2017-08-26 | Disposition: A | Discharge status: Home / Self Care | Payer: Medicaid Other | Source: Ambulatory Visit | Attending: Obstetrics and Gynecology | Admitting: Obstetrics and Gynecology

## 2017-08-26 VITALS — BP 104/65 | HR 81 | Wt 118.9 lb

## 2017-08-26 DIAGNOSIS — Z3490 Encounter for supervision of normal pregnancy, unspecified, unspecified trimester: Secondary | ICD-10-CM | POA: Insufficient documentation

## 2017-08-26 DIAGNOSIS — Z3491 Encounter for supervision of normal pregnancy, unspecified, first trimester: Secondary | ICD-10-CM | POA: Insufficient documentation

## 2017-08-26 DIAGNOSIS — O3680X0 Pregnancy with inconclusive fetal viability, not applicable or unspecified: Secondary | ICD-10-CM

## 2017-08-26 DIAGNOSIS — N912 Amenorrhea, unspecified: Secondary | ICD-10-CM | POA: Diagnosis present

## 2017-08-26 DIAGNOSIS — Z3A13 13 weeks gestation of pregnancy: Secondary | ICD-10-CM | POA: Diagnosis not present

## 2017-08-26 DIAGNOSIS — O99331 Smoking (tobacco) complicating pregnancy, first trimester: Secondary | ICD-10-CM | POA: Insufficient documentation

## 2017-08-26 DIAGNOSIS — F17200 Nicotine dependence, unspecified, uncomplicated: Secondary | ICD-10-CM | POA: Insufficient documentation

## 2017-08-26 DIAGNOSIS — IMO0001 Reserved for inherently not codable concepts without codable children: Secondary | ICD-10-CM | POA: Insufficient documentation

## 2017-08-26 DIAGNOSIS — Z3482 Encounter for supervision of other normal pregnancy, second trimester: Secondary | ICD-10-CM

## 2017-08-26 MED ORDER — PRENATAL VITAMINS 0.8 MG PO TABS: 1.0000 | ORAL_TABLET | Freq: Every day | ORAL | 12 refills | Status: DC

## 2017-08-26 NOTE — Addendum Note (Signed)
Addended by: Natale MilchSTALLING, Krislyn Donnan D on: 08/26/2017 11:00 AM   Modules accepted: Orders

## 2017-08-26 NOTE — Progress Notes (Signed)
mfm  INITIAL PRENATAL VISIT NOTE  Subjective:  Priscilla Sherman is a 37 y.o. [id number] at 7561w1d by US today being seen today for her initial prenatal visit. This is an unplanned pregnancy. She and partner are happy with the pregnancy. She was using depo for birth control previously but hasn't had shot since 11/2016. She has an obstetric history significant for 3 x SVD. She has a medical history significant for n/a.  Patient reports no complaints.  Contractions: Not present. Vag. Bleeding: None.   . Denies leaking of fluid.   Past Medical History:  Diagnosis Date  . Anxiety   . Chlamydia     Past Surgical History:  Procedure Laterality Date  . NO PAST SURGERIES      OB History  Gravida Para Term Preterm AB Living  5 3 3   1 3   SAB TAB Ectopic Multiple Live Births    1   0 3    # Outcome Date GA Lbr Len/2nd Weight Sex Delivery Anes PTL Lv  5 Current           4 Term 01/29/16 9571w3d 08:02 / 00:54 6 lb 13.4 oz (3.1 kg) F Vag-Spont EPI  LIV  3 Term 02/13/10     Vag-Spont   LIV  2 Term 10/12/04     Vag-Spont   LIV  1 TAB               Social History   Socioeconomic History  . Marital status: Single    Spouse name: None  . Number of children: None  . Years of education: None  . Highest education level: None  Social Needs  . Financial resource strain: None  . Food insecurity - worry: None  . Food insecurity - inability: None  . Transportation needs - medical: None  . Transportation needs - non-medical: None  Occupational History  . None  Tobacco Use  . Smoking status: Current Every Day Smoker    Packs/day: 0.10    Years: 10.00    Pack years: 1.00    Types: Cigarettes  . Smokeless tobacco: Never Used  Substance and Sexual Activity  . Alcohol use: Yes    Comment: socially  . Drug use: Yes    Types: Marijuana    Comment: last used 9 montha ago  . Sexual activity: Yes    Partners: Male    Birth control/protection: None  Other Topics Concern  . None  Social History  Narrative  . None    Family History  Problem Relation Age of Onset  . Cancer Father     (Not in a hospital admission)  No Known Allergies  Review of Systems: Negative except for what is mentioned in HPI.  Objective:   Vitals:   08/26/17 0924  BP: 104/65  Pulse: 81  Weight: 118 lb 14.4 oz (53.9 kg)    Fetal Status: Fetal Heart Rate (bpm): 158         Physical Exam: BP 104/65   Pulse 81   Wt 118 lb 14.4 oz (53.9 kg)   LMP 07/21/2017 (LMP Unknown)   BMI 21.75 kg/m  CONSTITUTIONAL: Well-developed, well-nourished female in no acute distress.  NEUROLOGIC: Alert and oriented to person, place, and time. Normal reflexes, muscle tone coordination. No cranial nerve deficit noted. PSYCHIATRIC: Normal mood and affect. Normal behavior. Normal judgment and thought content. SKIN: Skin is warm and dry. No rash noted. Not diaphoretic. No erythema. No pallor. Multiple well healed tatoos HENT:  Normocephalic, atraumatic, External right and left ear normal. Oropharynx is clear and moist EYES: Conjunctivae and EOM are normal. Pupils are equal, round, and reactive to light. No scleral icterus.  NECK: Normal range of motion, supple, no masses CARDIOVASCULAR: Normal heart rate noted, regular rhythm RESPIRATORY: Effort and breath sounds normal, no problems with respiration noted BREASTS: symmetric, non-tender, no masses palpable ABDOMEN: Soft, nontender, nondistended, gravid. GU: normal appearing external female genitalia, multiparous normal appearing cervix, thick white discharge in vagina, no lesions noted Bimanual: 14 weeks sized uterus, no adnexal tenderness or palpable lesions noted MUSCULOSKELETAL: Normal range of motion. EXT:  No edema and no tenderness. 2+ distal pulses.   Assessment and Plan:  Pregnancy: [id number] at [redacted]w[redacted]d by [place] today  1. Encounter for supervision of normal pregnancy, antepartum, unspecified gravidity - Obstetric Panel, Including HIV - VITAMIN D 25 Hydroxy (Vit-D  Deficiency, Fractures) - Varicella zoster antibody, IgG - Genetic Screening - Cytology - PAP - Cytology - PAP - Cervicovaginal ancillary only - [place] MFM OB COMP + 14 WK; Future  2. Smoking Encouraged cessation, patient states she will quit today    Preterm labor symptoms and general obstetric precautions including but not limited to vaginal bleeding, contractions, leaking of fluid and fetal movement were reviewed in detail with the patient.  Please refer to After Visit Summary for other counseling recommendations.   Return in about 4 weeks (around 09/23/2017) for OB visit.  [name] 08/26/2017 10:07 AM

## 2017-08-26 NOTE — Addendum Note (Signed)
Addended by: Natale MilchSTALLING, Kristy Catoe D on: 08/26/2017 11:04 AM   Modules accepted: Orders

## 2017-08-27 LAB — OBSTETRIC PANEL, INCLUDING HIV
ANTIBODY SCREEN: NEGATIVE
BASOS ABS: 0 10*3/uL (ref 0.0–0.2)
BASOS: 0 %
EOS (ABSOLUTE): 0.1 10*3/uL (ref 0.0–0.4)
Eos: 1 %
HIV Screen 4th Generation wRfx: NONREACTIVE
Hematocrit: 35.3 % (ref 34.0–46.6)
Hemoglobin: 12.1 g/dL (ref 11.1–15.9)
Hepatitis B Surface Ag: NEGATIVE
IMMATURE GRANS (ABS): 0 10*3/uL (ref 0.0–0.1)
IMMATURE GRANULOCYTES: 0 %
LYMPHS: 23 %
Lymphocytes Absolute: 1.7 10*3/uL (ref 0.7–3.1)
MCH: 30.5 pg (ref 26.6–33.0)
MCHC: 34.3 g/dL (ref 31.5–35.7)
MCV: 89 fL (ref 79–97)
MONOCYTES: 6 %
MONOS ABS: 0.4 10*3/uL (ref 0.1–0.9)
NEUTROS PCT: 70 %
Neutrophils Absolute: 5 10*3/uL (ref 1.4–7.0)
Platelets: 387 10*3/uL — ABNORMAL HIGH (ref 150–379)
RBC: 3.97 x10E6/uL (ref 3.77–5.28)
RDW: 12.6 % (ref 12.3–15.4)
RPR Ser Ql: NONREACTIVE
Rh Factor: POSITIVE
Rubella Antibodies, IGG: 3.66 index (ref >0.99)
WBC: 7.2 10*3/uL (ref 3.4–10.8)

## 2017-08-27 LAB — VITAMIN D 25 HYDROXY (VIT D DEFICIENCY, FRACTURES): VIT D 25 HYDROXY: 11.3 ng/mL — AB (ref 30.0–100.0)

## 2017-08-27 LAB — CERVICOVAGINAL ANCILLARY ONLY
Bacterial vaginitis: NEGATIVE
CANDIDA VAGINITIS: NEGATIVE
Chlamydia: NEGATIVE
Neisseria Gonorrhea: NEGATIVE
Trichomonas: NEGATIVE

## 2017-08-27 LAB — VARICELLA ZOSTER ANTIBODY, IGG: VARICELLA: 3366 {index} (ref >165)

## 2017-08-28 ENCOUNTER — Encounter: Payer: Self-pay | Admitting: Obstetrics and Gynecology

## 2017-08-28 LAB — URINE CULTURE, OB REFLEX

## 2017-08-28 LAB — CULTURE, OB URINE

## 2017-08-28 NOTE — Progress Notes (Signed)
error 

## 2017-09-01 ENCOUNTER — Encounter: Payer: Self-pay | Admitting: Obstetrics and Gynecology

## 2017-09-02 LAB — CYTOLOGY - PAP
Diagnosis: NEGATIVE
HPV: NOT DETECTED

## 2017-09-03 LAB — HEMOGLOBINOPATHY EVALUATION
HEMOGLOBIN A2 QUANTITATION: 2.3 % (ref 1.8–3.2)
HEMOGLOBIN F QUANTITATION: 0 % (ref 0.0–2.0)
HGB A: 97.7 % (ref 96.4–98.8)
HGB C: 0 %
HGB S: 0 %
HGB VARIANT: 0 %

## 2017-09-03 LAB — CYSTIC FIBROSIS MUTATION 97: Interpretation: NOT DETECTED

## 2017-09-04 ENCOUNTER — Encounter: Payer: Self-pay | Admitting: Obstetrics and Gynecology

## 2017-09-23 ENCOUNTER — Encounter: Payer: Medicaid Other | Admitting: Obstetrics & Gynecology

## 2017-09-24 ENCOUNTER — Encounter (HOSPITAL_COMMUNITY): Payer: Self-pay | Admitting: Obstetrics and Gynecology

## 2017-10-01 ENCOUNTER — Ambulatory Visit (HOSPITAL_COMMUNITY)
Admission: RE | Admit: 2017-10-01 | Discharge: 2017-10-01 | Disposition: A | Discharge status: Home / Self Care | Payer: Medicaid Other | Source: Ambulatory Visit | Attending: Obstetrics and Gynecology | Admitting: Obstetrics and Gynecology

## 2017-10-01 ENCOUNTER — Other Ambulatory Visit: Payer: Self-pay | Admitting: Obstetrics and Gynecology

## 2017-10-01 DIAGNOSIS — Z3490 Encounter for supervision of normal pregnancy, unspecified, unspecified trimester: Secondary | ICD-10-CM

## 2017-10-01 DIAGNOSIS — Z3A18 18 weeks gestation of pregnancy: Secondary | ICD-10-CM | POA: Insufficient documentation

## 2017-10-01 DIAGNOSIS — Z3689 Encounter for other specified antenatal screening: Secondary | ICD-10-CM | POA: Insufficient documentation

## 2017-10-01 DIAGNOSIS — O09522 Supervision of elderly multigravida, second trimester: Secondary | ICD-10-CM

## 2017-10-04 ENCOUNTER — Emergency Department (HOSPITAL_COMMUNITY)
Admission: EM | Admit: 2017-10-04 | Discharge: 2017-10-04 | Disposition: A | Discharge status: Home / Self Care | Payer: Medicaid Other | Attending: Emergency Medicine | Admitting: Emergency Medicine

## 2017-10-04 ENCOUNTER — Encounter (HOSPITAL_COMMUNITY): Payer: Self-pay | Admitting: *Deleted

## 2017-10-04 DIAGNOSIS — Z79899 Other long term (current) drug therapy: Secondary | ICD-10-CM | POA: Insufficient documentation

## 2017-10-04 DIAGNOSIS — O219 Vomiting of pregnancy, unspecified: Secondary | ICD-10-CM | POA: Diagnosis present

## 2017-10-04 DIAGNOSIS — O99332 Smoking (tobacco) complicating pregnancy, second trimester: Secondary | ICD-10-CM | POA: Insufficient documentation

## 2017-10-04 DIAGNOSIS — Z3A16 16 weeks gestation of pregnancy: Secondary | ICD-10-CM | POA: Insufficient documentation

## 2017-10-04 DIAGNOSIS — F17210 Nicotine dependence, cigarettes, uncomplicated: Secondary | ICD-10-CM | POA: Diagnosis not present

## 2017-10-04 DIAGNOSIS — K529 Noninfective gastroenteritis and colitis, unspecified: Secondary | ICD-10-CM

## 2017-10-04 DIAGNOSIS — O99612 Diseases of the digestive system complicating pregnancy, second trimester: Secondary | ICD-10-CM | POA: Diagnosis not present

## 2017-10-04 LAB — COMPREHENSIVE METABOLIC PANEL
ALT: 22 U/L (ref 14–54)
AST: 24 U/L (ref 15–41)
Albumin: 3.8 g/dL (ref 3.5–5.0)
Alkaline Phosphatase: 52 U/L (ref 38–126)
Anion gap: 14 (ref 5–15)
BUN: 8 mg/dL (ref 6–20)
CO2: 16 mmol/L — ABNORMAL LOW (ref 22–32)
Calcium: 8.9 mg/dL (ref 8.9–10.3)
Chloride: 105 mmol/L (ref 101–111)
Creatinine, Ser: 0.57 mg/dL (ref 0.44–1.00)
Glucose, Bld: 98 mg/dL (ref 65–99)
POTASSIUM: 3.3 mmol/L — AB (ref 3.5–5.1)
Sodium: 135 mmol/L (ref 135–145)
Total Bilirubin: 0.9 mg/dL (ref 0.3–1.2)
Total Protein: 6.8 g/dL (ref 6.5–8.1)

## 2017-10-04 LAB — CBC
HEMATOCRIT: 37.2 % (ref 36.0–46.0)
Hemoglobin: 12.6 g/dL (ref 12.0–15.0)
MCH: 31.3 pg (ref 26.0–34.0)
MCHC: 33.9 g/dL (ref 30.0–36.0)
MCV: 92.5 fL (ref 78.0–100.0)
PLATELETS: 317 10*3/uL (ref 150–400)
RBC: 4.02 MIL/uL (ref 3.87–5.11)
RDW: 14 % (ref 11.5–15.5)
WBC: 15.5 10*3/uL — ABNORMAL HIGH (ref 4.0–10.5)

## 2017-10-04 LAB — LIPASE, BLOOD: LIPASE: 24 U/L (ref 11–51)

## 2017-10-04 LAB — I-STAT BETA HCG BLOOD, ED (MC, WL, AP ONLY): I-stat hCG, quantitative: >2000 m[IU]/mL — ABNORMAL HIGH (ref <5)

## 2017-10-04 MED ORDER — ONDANSETRON HCL 4 MG/2ML IJ SOLN
4.0000 mg | Freq: Once | INTRAMUSCULAR | Status: AC
  Administered 2017-10-04: 4 mg via INTRAVENOUS
  Filled 2017-10-04: qty 2

## 2017-10-04 MED ORDER — POTASSIUM CHLORIDE CRYS ER 20 MEQ PO TBCR
40.0000 meq | EXTENDED_RELEASE_TABLET | Freq: Once | ORAL | Status: AC
  Administered 2017-10-04: 40 meq via ORAL
  Filled 2017-10-04: qty 2

## 2017-10-04 MED ORDER — ONDANSETRON 4 MG PO TBDP: 4.0000 mg | ORAL_TABLET | Freq: Three times a day (TID) | ORAL | 0 refills | Status: DC | PRN

## 2017-10-04 MED ORDER — SODIUM CHLORIDE 0.9 % IV BOLUS (SEPSIS)
1000.0000 mL | Freq: Once | INTRAVENOUS | Status: AC
  Administered 2017-10-04: 1000 mL via INTRAVENOUS

## 2017-10-04 MED ORDER — SODIUM CHLORIDE 0.9 % IV BOLUS (SEPSIS)
500.0000 mL | Freq: Once | INTRAVENOUS | Status: AC
  Administered 2017-10-04: 500 mL via INTRAVENOUS

## 2017-10-04 NOTE — ED Triage Notes (Addendum)
Pt in c/o n/v/d onset today, pts 7420 mth old has same symptoms, pt reports 7 vomiting episodes today and liquid stools x 4 today, pt denies pain and fever and chills, A&O x4, pt [redacted] wks pregnant, denies vaginal bleeding or discharge

## 2017-10-04 NOTE — Discharge Instructions (Signed)
Follow up with your primary care doctor to discuss your hospital visit. Continue to hydrate orally with small sips of fluids throughout the day. Use Zofran as directed for nausea & vomiting.  ° °The 'BRAT' diet is suggested, then progress to diet as tolerated as symptoms abate.  °Bananas.  °Rice.  °Applesauce.  °Toast (and other simple starches such as crackers, potatoes, noodles).  ° °SEEK IMMEDIATE MEDICAL ATTENTION IF: °You begin having localized abdominal pain that does not go away or becomes severe °A temperature above 101 develops °Repeated vomiting occurs (multiple uncontrollable episodes) or you are unable to keep fluids down °Blood is being passed in stools or vomit (bright red or black tarry stools).  °New or worsening symptoms develop, any additional concerns.  °

## 2017-10-04 NOTE — ED Provider Notes (Signed)
[facility] EMERGENCY DEPARTMENT Provider Note   CSN: [id number] Arrival date & time: 10/04/17  1408     History   Chief Complaint No chief complaint on file.   HPI Priscilla Sherman is a 37 y.o. female.  The history is provided by the patient and medical records. No language interpreter was used.    Priscilla Sherman is a 37 y.o. female currently [redacted] weeks pregnant who presents to the Emergency Department complaining of nausea, vomiting and diarrhea which began at 5 AM this morning.  She reports about 7 episodes of emesis and 4 loose, nonbloody stools today.  Her 58-month-year-old daughter is currently at the doctor for similar symptoms.  Her symptoms began yesterday.  She denies any medications taken prior to arrival.  She is on prenatal vitamins, but was unable to take this today due to emesis.  She reports not being able to keep down any food or fluids since symptom onset.  She denies any abdominal pain, urinary symptoms, vaginal discharge or vaginal bleeding.  No fever or chills. No recent travel.  Past Medical History:  Diagnosis Date  . Anxiety   . Chlamydia     Patient Active Problem List   Diagnosis Date Noted  . Encounter for supervision of normal pregnancy, unspecified, unspecified trimester 08/26/2017  . Smoking 08/26/2017    Past Surgical History:  Procedure Laterality Date  . NO PAST SURGERIES      OB History    Gravida Para Term Preterm AB Living   5 3 3   1 3    SAB TAB Ectopic Multiple Live Births     1   0 3       Home Medications    Prior to Admission medications   Medication Sig Start Date End Date Taking? Authorizing Provider  Multiple Vitamins-Minerals (MULTIVITAMIN WITH MINERALS) tablet Take 1 tablet by mouth daily.    [provider]  ondansetron (ZOFRAN ODT) 4 MG disintegrating tablet Take 1 tablet (4 mg total) by mouth every 8 (eight) hours as needed for nausea or vomiting. 10/04/17   Ward, Chase Picket, PA-C  Prenatal  Multivit-Min-Fe-FA (PRENATAL VITAMINS) 0.8 MG tablet Take 1 tablet by mouth daily. 08/26/17   [name], MD    Family History Family History  Problem Relation Age of Onset  . Cancer Father     Social History Social History   Tobacco Use  . Smoking status: Current Every Day Smoker    Packs/day: 0.10    Years: 10.00    Pack years: 1.00    Types: Cigarettes  . Smokeless tobacco: Never Used  Substance Use Topics  . Alcohol use: Yes    Comment: socially  . Drug use: Yes    Types: Marijuana    Comment: last used 9 montha ago     Allergies   Patient has no known allergies.   Review of Systems Review of Systems  Gastrointestinal: Positive for diarrhea, nausea and vomiting. Negative for abdominal pain, blood in stool and constipation.  All other systems reviewed and are negative.    Physical Exam Updated Vital Signs BP 128/74   Pulse 78   Temp 97.9 F (36.6 C) (Oral)   Resp 18   LMP 07/21/2017 (LMP Unknown)   SpO2 99%   Breastfeeding? No   Physical Exam  Constitutional: She is oriented to person, place, and time. She appears well-developed and well-nourished. No distress.  Non-toxic appearing.  HENT:  Head: Normocephalic and atraumatic.  Tacky mucus membranes.  Neck: Neck supple.  Cardiovascular: Normal rate, regular rhythm and normal heart sounds.  No murmur heard. Pulmonary/Chest: Effort normal and breath sounds normal. No respiratory distress.  Abdominal: Soft. Bowel sounds are normal. She exhibits no distension.  No abdominal or CVA tenderness.  Neurological: She is alert and oriented to person, place, and time.  Skin: Skin is warm and dry.  Nursing note and vitals reviewed.    ED Treatments / Results  Labs (all labs ordered are listed, but only abnormal results are displayed) Labs Reviewed  COMPREHENSIVE METABOLIC PANEL - Abnormal; Notable for the following components:      Result Value   Potassium 3.3 (*)    CO2 16 (*)    All other  components within normal limits  CBC - Abnormal; Notable for the following components:   WBC 15.5 (*)    All other components within normal limits  I-STAT BETA HCG BLOOD, ED ([facility], [facility], [facility] ONLY) - Abnormal; Notable for the following components:   I-stat hCG, quantitative >2,000.0 (*)    All other components within normal limits  LIPASE, BLOOD    EKG  EKG Interpretation None       Radiology No results found.  Procedures Procedures (including critical care time)  Medications Ordered in ED Medications  sodium chloride 0.9 % bolus 1,000 mL (0 mLs Intravenous Stopped 10/04/17 1851)  ondansetron (ZOFRAN) injection 4 mg (4 mg Intravenous Given 10/04/17 1658)  sodium chloride 0.9 % bolus 500 mL (0 mLs Intravenous Stopped 10/04/17 1948)  potassium chloride SA (K-DUR,KLOR-CON) CR tablet 40 mEq (40 mEq Oral Given 10/04/17 1854)     Initial Impression / Assessment and Plan / ED Course  I have reviewed the triage vital signs and the nursing notes.  Pertinent labs & imaging results that were available during my care of the patient were reviewed by me and considered in my medical decision making (see chart for details).    Frederich Charin J Lamar is a 37 y.o. female who presents to ED for n/v/d which began this morning. Daughter at home with similar symptoms which began yesterday. She is [redacted] weeks pregnant. She is not experiencing any abdominal pain, vaginal discharge or vaginal bleeding.  Patient appears dry on exam.  1500 cc fluids given.  Initially tachycardic which did resolve after fluids.  She is afebrile and hemodynamically stable.  Again benign abdominal exam.  Labs reviewed.  Potassium replenished.  Patient reevaluated following nausea medication administration.  She has not p.o. without complaints.  Repeat abdominal exam with no tenderness.  Likely viral syndrome.  Symptomatic home care instructions / return precautions discussed and all questions answered.   Final Clinical Impressions(s) / ED Diagnoses     Final diagnoses:  Gastroenteritis    ED Discharge Orders        Ordered    ondansetron (ZOFRAN ODT) 4 MG disintegrating tablet  Every 8 hours PRN     10/04/17 1914       Ward, Chase PicketJaime Pilcher, PA-C 10/04/17 1956    Margarita Grizzleay, Danielle, MD 10/05/17 [phone]

## 2017-10-04 NOTE — ED Notes (Signed)
Patient asking about delays for room.  Asking if we can place and IV while she is waiting in the lobby.  This RN explained that fluids are a med and must be ordered once she is seen by the MD.  Patient verbalized understanding and returned to waiting

## 2017-10-07 ENCOUNTER — Ambulatory Visit: Payer: Self-pay | Admitting: Obstetrics & Gynecology

## 2017-10-20 ENCOUNTER — Ambulatory Visit (INDEPENDENT_AMBULATORY_CARE_PROVIDER_SITE_OTHER): Payer: Medicaid Other | Admitting: Obstetrics & Gynecology

## 2017-10-20 ENCOUNTER — Other Ambulatory Visit: Payer: Self-pay

## 2017-10-20 ENCOUNTER — Encounter: Payer: Self-pay | Admitting: Obstetrics & Gynecology

## 2017-10-20 VITALS — BP 100/65 | HR 85 | Wt 125.0 lb

## 2017-10-20 DIAGNOSIS — Z3490 Encounter for supervision of normal pregnancy, unspecified, unspecified trimester: Secondary | ICD-10-CM

## 2017-10-20 DIAGNOSIS — Z3482 Encounter for supervision of other normal pregnancy, second trimester: Secondary | ICD-10-CM

## 2017-10-20 NOTE — Progress Notes (Signed)
Reports no problems today. 

## 2017-10-20 NOTE — Progress Notes (Signed)
   PRENATAL VISIT NOTE  Subjective:  Frederich Charin J Granderson is a 37 y.o. [id number] at 4064w5d being seen today for ongoing prenatal care.  She is currently monitored for the following issues for this low-risk pregnancy and has Encounter for supervision of normal pregnancy, unspecified, unspecified trimester and Smoking on their problem list.  Patient reports no complaints.  Contractions: Not present. Vag. Bleeding: None.   . Denies leaking of fluid.   The following portions of the patient's history were reviewed and updated as appropriate: allergies, current medications, past family history, past medical history, past social history, past surgical history and problem list. Problem list updated.  Objective:   Vitals:   10/20/17 1425  BP: 100/65  Pulse: 85  Weight: 125 lb (56.7 kg)    Fetal Status: Fetal Heart Rate (bpm): 156         General:  Alert, oriented and cooperative. Patient is in no acute distress.  Skin: Skin is warm and dry. No rash noted.   Cardiovascular: Normal heart rate noted  Respiratory: Normal respiratory effort, no problems with respiration noted  Abdomen: Soft, gravid, appropriate for gestational age.  Pain/Pressure: Absent     Pelvic: Cervical exam deferred        Extremities: Normal range of motion.  Edema: None  Mental Status:  Normal mood and affect. Normal behavior. Normal judgment and thought content.   Assessment and Plan:  Pregnancy: [id number] at 7564w5d  1. Encounter for supervision of normal pregnancy, antepartum, unspecified gravidity US anatomy normal  2. Encounter for supervision of other normal pregnancy in second trimester Doing well  Preterm labor symptoms and general obstetric precautions including but not limited to vaginal bleeding, contractions, leaking of fluid and fetal movement were reviewed in detail with the patient. Please refer to After Visit Summary for other counseling recommendations.  Return in 4 weeks (on 11/17/2017).   Scheryl DarterJames Kloee Ballew, MD

## 2017-11-17 ENCOUNTER — Encounter: Payer: Medicaid Other | Admitting: Obstetrics and Gynecology

## 2017-12-03 ENCOUNTER — Encounter: Payer: Medicaid Other | Admitting: Obstetrics and Gynecology

## 2017-12-05 ENCOUNTER — Ambulatory Visit (INDEPENDENT_AMBULATORY_CARE_PROVIDER_SITE_OTHER): Payer: Medicaid Other | Admitting: Advanced Practice Midwife

## 2017-12-05 ENCOUNTER — Other Ambulatory Visit: Payer: Self-pay

## 2017-12-05 DIAGNOSIS — Z23 Encounter for immunization: Secondary | ICD-10-CM | POA: Diagnosis not present

## 2017-12-05 DIAGNOSIS — Z3490 Encounter for supervision of normal pregnancy, unspecified, unspecified trimester: Secondary | ICD-10-CM

## 2017-12-05 DIAGNOSIS — Z3493 Encounter for supervision of normal pregnancy, unspecified, third trimester: Secondary | ICD-10-CM

## 2017-12-05 NOTE — Progress Notes (Signed)
   PRENATAL VISIT NOTE  Subjective:  Priscilla Sherman is a 37 y.o. [id number] at [redacted]w[redacted]d being seen today for ongoing prenatal care.  She is currently monitored for the following issues for this low-risk pregnancy and has Encounter for supervision of normal pregnancy, unspecified, unspecified trimester and Smoking on their problem list.  Patient reports no complaints.  Contractions: Not present. Vag. Bleeding: None.  Movement: Present. Denies leaking of fluid.   The following portions of the patient's history were reviewed and updated as appropriate: allergies, current medications, past family history, past medical history, past social history, past surgical history and problem list. Problem list updated.  Objective:   Vitals:   12/05/17 0846  BP: 100/60  Pulse: 84  Weight: 129 lb (58.5 kg)    Fetal Status:     Movement: Present    FHT present by doppler  General:  Alert, oriented and cooperative. Patient is in no acute distress.  Skin: Skin is warm and dry. No rash noted.   Cardiovascular: Normal heart rate noted  Respiratory: Normal respiratory effort, no problems with respiration noted  Abdomen: Soft, gravid, appropriate for gestational age.  Pain/Pressure: Absent     Pelvic: Cervical exam deferred        Extremities: Normal range of motion.  Edema: None  Mental Status: Normal mood and affect. Normal behavior. Normal judgment and thought content.   Assessment and Plan:  Pregnancy: [id number] at [redacted]w[redacted]d  1. Encounter for supervision of normal pregnancy, antepartum, unspecified gravidity --Anticipatory guidance about next prenatal visits/weeks of pregnancy --Pt to schedule lab only visit as soon as possible for GTT as she is not fasting today --Discussed contraception choices with pt.  Pt would like LARC, probably IUD.  Preterm labor symptoms and general obstetric precautions including but not limited to vaginal bleeding, contractions, leaking of fluid and fetal movement were reviewed in  detail with the patient. Please refer to After Visit Summary for other counseling recommendations.  Return in about 2 weeks (around 12/19/2017).  No future appointments.  Sharen Counter, CNM

## 2017-12-05 NOTE — Patient Instructions (Signed)
Third Trimester of Pregnancy The third trimester is from week 28 through week 40 (months 7 through 9). The third trimester is a time when the unborn baby (fetus) is growing rapidly. At the end of the ninth month, the fetus is about 20 inches in length and weighs 6-10 pounds. Body changes during your third trimester Your body will continue to go through many changes during pregnancy. The changes vary from woman to woman. During the third trimester:  Your weight will continue to increase. You can expect to gain 25-35 pounds (11-16 kg) by the end of the pregnancy.  You may begin to get stretch marks on your hips, abdomen, and breasts.  You may urinate more often because the fetus is moving lower into your pelvis and pressing on your bladder.  You may develop or continue to have heartburn. This is caused by increased hormones that slow down muscles in the digestive tract.  You may develop or continue to have constipation because increased hormones slow digestion and cause the muscles that push waste through your intestines to relax.  You may develop hemorrhoids. These are swollen veins (varicose veins) in the rectum that can itch or be painful.  You may develop swollen, bulging veins (varicose veins) in your legs.  You may have increased body aches in the pelvis, back, or thighs. This is due to weight gain and increased hormones that are relaxing your joints.  You may have changes in your hair. These can include thickening of your hair, rapid growth, and changes in texture. Some women also have hair loss during or after pregnancy, or hair that feels dry or thin. Your hair will most likely return to normal after your baby is born.  Your breasts will continue to grow and they will continue to become tender. A yellow fluid (colostrum) may leak from your breasts. This is the first milk you are producing for your baby.  Your belly button may stick out.  You may notice more swelling in your hands,  face, or ankles.  You may have increased tingling or numbness in your hands, arms, and legs. The skin on your belly may also feel numb.  You may feel short of breath because of your expanding uterus.  You may have more problems sleeping. This can be caused by the size of your belly, increased need to urinate, and an increase in your body's metabolism.  You may notice the fetus "dropping," or moving lower in your abdomen (lightening).  You may have increased vaginal discharge.  You may notice your joints feel loose and you may have pain around your pelvic bone.  What to expect at prenatal visits You will have prenatal exams every 2 weeks until week 36. Then you will have weekly prenatal exams. During a routine prenatal visit:  You will be weighed to make sure you and the baby are growing normally.  Your blood pressure will be taken.  Your abdomen will be measured to track your baby's growth.  The fetal heartbeat will be listened to.  Any test results from the previous visit will be discussed.  You may have a cervical check near your due date to see if your cervix has softened or thinned (effaced).  You will be tested for Group B streptococcus. This happens between 35 and 37 weeks.  Your health care provider may ask you:  What your birth plan is.  How you are feeling.  If you are feeling the baby move.  If you have had   any abnormal symptoms, such as leaking fluid, bleeding, severe headaches, or abdominal cramping.  If you are using any tobacco products, including cigarettes, chewing tobacco, and electronic cigarettes.  If you have any questions.  Other tests or screenings that may be performed during your third trimester include:  Blood tests that check for low iron levels (anemia).  Fetal testing to check the health, activity level, and growth of the fetus. Testing is done if you have certain medical conditions or if there are problems during the  pregnancy.  Nonstress test (NST). This test checks the health of your baby to make sure there are no signs of problems, such as the baby not getting enough oxygen. During this test, a belt is placed around your belly. The baby is made to move, and its heart rate is monitored during movement.  What is false labor? False labor is a condition in which you feel small, irregular tightenings of the muscles in the womb (contractions) that usually go away with rest, changing position, or drinking water. These are called Braxton Hicks contractions. Contractions may last for hours, days, or even weeks before true labor sets in. If contractions come at regular intervals, become more frequent, increase in intensity, or become painful, you should see your health care provider. What are the signs of labor?  Abdominal cramps.  Regular contractions that start at 10 minutes apart and become stronger and more frequent with time.  Contractions that start on the top of the uterus and spread down to the lower abdomen and back.  Increased pelvic pressure and dull back pain.  A watery or bloody mucus discharge that comes from the vagina.  Leaking of amniotic fluid. This is also known as your "water breaking." It could be a slow trickle or a gush. Let your health care provider know if it has a color or strange odor. If you have any of these signs, call your health care provider right away, even if it is before your due date. Follow these instructions at home: Medicines  Follow your health care provider's instructions regarding medicine use. Specific medicines may be either safe or unsafe to take during pregnancy.  Take a prenatal vitamin that contains at least 600 micrograms (mcg) of folic acid.  If you develop constipation, try taking a stool softener if your health care provider approves. Eating and drinking  Eat a balanced diet that includes fresh fruits and vegetables, whole grains, good sources of protein  such as meat, eggs, or tofu, and low-fat dairy. Your health care provider will help you determine the amount of weight gain that is right for you.  Avoid raw meat and uncooked cheese. These carry germs that can cause birth defects in the baby.  If you have low calcium intake from food, talk to your health care provider about whether you should take a daily calcium supplement.  Eat four or five small meals rather than three large meals a day.  Limit foods that are high in fat and processed sugars, such as fried and sweet foods.  To prevent constipation: ? Drink enough fluid to keep your urine clear or pale yellow. ? Eat foods that are high in fiber, such as fresh fruits and vegetables, whole grains, and beans. Activity  Exercise only as directed by your health care provider. Most women can continue their usual exercise routine during pregnancy. Try to exercise for 30 minutes at least 5 days a week. Stop exercising if you experience uterine contractions.  Avoid heavy   lifting.  Do not exercise in extreme heat or humidity, or at high altitudes.  Wear low-heel, comfortable shoes.  Practice good posture.  You may continue to have sex unless your health care provider tells you otherwise. Relieving pain and discomfort  Take frequent breaks and rest with your legs elevated if you have leg cramps or low back pain.  Take warm sitz baths to soothe any pain or discomfort caused by hemorrhoids. Use hemorrhoid cream if your health care provider approves.  Wear a good support bra to prevent discomfort from breast tenderness.  If you develop varicose veins: ? Wear support pantyhose or compression stockings as told by your healthcare provider. ? Elevate your feet for 15 minutes, 3-4 times a day. Prenatal care  Write down your questions. Take them to your prenatal visits.  Keep all your prenatal visits as told by your health care provider. This is important. Safety  Wear your seat belt at  all times when driving.  Make a list of emergency phone numbers, including numbers for family, friends, the hospital, and police and fire departments. General instructions  Avoid cat litter boxes and soil used by cats. These carry germs that can cause birth defects in the baby. If you have a cat, ask someone to clean the litter box for you.  Do not travel far distances unless it is absolutely necessary and only with the approval of your health care provider.  Do not use hot tubs, steam rooms, or saunas.  Do not drink alcohol.  Do not use any products that contain nicotine or tobacco, such as cigarettes and e-cigarettes. If you need help quitting, ask your health care provider.  Do not use any medicinal herbs or unprescribed drugs. These chemicals affect the formation and growth of the baby.  Do not douche or use tampons or scented sanitary pads.  Do not cross your legs for long periods of time.  To prepare for the arrival of your baby: ? Take prenatal classes to understand, practice, and ask questions about labor and delivery. ? Make a trial run to the hospital. ? Visit the hospital and tour the maternity area. ? Arrange for maternity or paternity leave through employers. ? Arrange for family and friends to take care of pets while you are in the hospital. ? Purchase a rear-facing car seat and make sure you know how to install it in your car. ? Pack your hospital bag. ? Prepare the baby's nursery. Make sure to remove all pillows and stuffed animals from the baby's crib to prevent suffocation.  Visit your dentist if you have not gone during your pregnancy. Use a soft toothbrush to brush your teeth and be gentle when you floss. Contact a health care provider if:  You are unsure if you are in labor or if your water has broken.  You become dizzy.  You have mild pelvic cramps, pelvic pressure, or nagging pain in your abdominal area.  You have lower back pain.  You have persistent  nausea, vomiting, or diarrhea.  You have an unusual or bad smelling vaginal discharge.  You have pain when you urinate. Get help right away if:  Your water breaks before 37 weeks.  You have regular contractions less than 5 minutes apart before 37 weeks.  You have a fever.  You are leaking fluid from your vagina.  You have spotting or bleeding from your vagina.  You have severe abdominal pain or cramping.  You have rapid weight loss or weight gain.    You have shortness of breath with chest pain.  You notice sudden or extreme swelling of your face, hands, ankles, feet, or legs.  Your baby makes fewer than 10 movements in 2 hours.  You have severe headaches that do not go away when you take medicine.  You have vision changes. Summary  The third trimester is from week 28 through week 40, months 7 through 9. The third trimester is a time when the unborn baby (fetus) is growing rapidly.  During the third trimester, your discomfort may increase as you and your baby continue to gain weight. You may have abdominal, leg, and back pain, sleeping problems, and an increased need to urinate.  During the third trimester your breasts will keep growing and they will continue to become tender. A yellow fluid (colostrum) may leak from your breasts. This is the first milk you are producing for your baby.  False labor is a condition in which you feel small, irregular tightenings of the muscles in the womb (contractions) that eventually go away. These are called Braxton Hicks contractions. Contractions may last for hours, days, or even weeks before true labor sets in.  Signs of labor can include: abdominal cramps; regular contractions that start at 10 minutes apart and become stronger and more frequent with time; watery or bloody mucus discharge that comes from the vagina; increased pelvic pressure and dull back pain; and leaking of amniotic fluid. This information is not intended to replace advice  given to you by your health care provider. Make sure you discuss any questions you have with your health care provider. Document Released: 07/16/2001 Document Revised: 12/28/2015 Document Reviewed: 09/22/2012 [employer] Interactive Patient Education  2017 [employer]  

## 2017-12-05 NOTE — Progress Notes (Signed)
TDAP given in right deltoid, tolerated well. 

## 2017-12-19 ENCOUNTER — Ambulatory Visit (INDEPENDENT_AMBULATORY_CARE_PROVIDER_SITE_OTHER): Payer: Medicaid Other

## 2017-12-19 ENCOUNTER — Other Ambulatory Visit: Payer: Medicaid Other

## 2017-12-19 VITALS — BP 111/72 | HR 85 | Wt 133.6 lb

## 2017-12-19 DIAGNOSIS — Z3483 Encounter for supervision of other normal pregnancy, third trimester: Secondary | ICD-10-CM

## 2017-12-19 DIAGNOSIS — Z3490 Encounter for supervision of normal pregnancy, unspecified, unspecified trimester: Secondary | ICD-10-CM

## 2017-12-19 NOTE — Patient Instructions (Signed)
Glucose Tolerance Test During Pregnancy The glucose tolerance test (GTT) is a blood test used to determine if you have developed a type of diabetes during pregnancy (gestational diabetes). This is when your body does not properly process sugar (glucose) in the food you eat, resulting in high blood glucose levels. Typically, a GTT is done after you have had a 1-hour glucose test with results that indicate you possibly have gestational diabetes. It may also be done if:  You have a history of giving birth to very large babies or have experienced repeated fetal loss (stillbirth).  You have signs and symptoms of diabetes, such as: ? Changes in your vision. ? Tingling or numbness in your hands or feet. ? Changes in hunger, thirst, and urination not otherwise explained by your pregnancy.  The GTT lasts about 3 hours. You will be given a sugar-water solution to drink at the beginning of the test. You will have blood drawn before you drink the solution and then again 1, 2, and 3 hours after you drink it. You will not be allowed to eat or drink anything else during the test. You must remain at the testing location to make sure that your blood is drawn on time. You should also avoid exercising during the test, because exercise can alter test results. How do I prepare for this test? Eat normally for 3 days prior to the GTT test, including having plenty of carbohydrate-rich foods. Do not eat or drink anything except water during the final 12 hours before the test. In addition, your health care provider may ask you to stop taking certain medicines before the test. What do the results mean? It is your responsibility to obtain your test results. Ask the lab or department performing the test when and how you will get your results. Contact your health care provider to discuss any questions you have about your results. Range of Normal Values Ranges for normal values may vary among different labs and hospitals. You  should always check with your health care provider after having lab work or other tests done to discuss whether your values are considered within normal limits. Normal levels of blood glucose are as follows:  Fasting: less than 105 mg/dL.  1 hour after drinking the solution: less than 190 mg/dL.  2 hours after drinking the solution: less than 165 mg/dL.  3 hours after drinking the solution: less than 145 mg/dL.  Some substances can interfere with GTT results. These may include:  Blood pressure and heart failure medicines, including beta blockers, furosemide, and thiazides.  Anti-inflammatory medicines, including aspirin.  Nicotine.  Some psychiatric medicines.  Meaning of Results Outside Normal Value Ranges GTT test results that are above normal values may indicate a number of health problems, such as:  Gestational diabetes.  Acute stress response.  Cushing syndrome.  Tumors such as pheochromocytoma or glucagonoma.  Long-term kidney problems.  Pancreatitis.  Hyperthyroidism.  Current infection.  Discuss your test results with your health care provider. He or she will use the results to make a diagnosis and determine a treatment plan that is right for you. This information is not intended to replace advice given to you by your health care provider. Make sure you discuss any questions you have with your health care provider. Document Released: 01/21/2012 Document Revised: 12/28/2015 Document Reviewed: 11/26/2013 Elsevier Interactive Patient Education  2018 Elsevier Inc.  Safe Medications in Pregnancy   Acne: Benzoyl Peroxide Salicylic Acid  Backache/Headache: Tylenol: 2 regular strength every 4 hours   OR              2 Extra strength every 6 hours  Colds/Coughs/Allergies: Benadryl (alcohol free) 25 mg every 6 hours as needed Breath right strips Claritin Cepacol throat lozenges Chloraseptic throat spray Cold-Eeze- up to three times per day Cough drops,  alcohol free Flonase (by prescription only) Guaifenesin Mucinex Robitussin DM (plain only, alcohol free) Saline nasal spray/drops Sudafed (pseudoephedrine) & Actifed ** use only after [redacted] weeks gestation and if you do not have high blood pressure Tylenol Vicks Vaporub Zinc lozenges Zyrtec   Constipation: Colace Ducolax suppositories Fleet enema Glycerin suppositories Metamucil Milk of magnesia Miralax Senokot Smooth move tea  Diarrhea: Kaopectate Imodium A-D  *NO pepto Bismol  Hemorrhoids: Anusol Anusol HC Preparation H Tucks  Indigestion: Tums Maalox Mylanta Zantac  Pepcid  Insomnia: Benadryl (alcohol free) 25mg every 6 hours as needed Tylenol PM Unisom, no Gelcaps  Leg Cramps: Tums MagGel  Nausea/Vomiting:  Bonine Dramamine Emetrol Ginger extract Sea bands Meclizine  Nausea medication to take during pregnancy:  Unisom (doxylamine succinate 25 mg tablets) Take one tablet daily at bedtime. If symptoms are not adequately controlled, the dose can be increased to a maximum recommended dose of two tablets daily (1/2 tablet in the morning, 1/2 tablet mid-afternoon and one at bedtime). Vitamin B6 100mg tablets. Take one tablet twice a day (up to 200 mg per day).  Skin Rashes: Aveeno products Benadryl cream or 25mg every 6 hours as needed Calamine Lotion 1% cortisone cream  Yeast infection: Gyne-lotrimin 7 Monistat 7   **If taking multiple medications, please check labels to avoid duplicating the same active ingredients **take medication as directed on the label ** Do not exceed 4000 mg of tylenol in 24 hours **Do not take medications that contain aspirin or ibuprofen     

## 2017-12-19 NOTE — Progress Notes (Signed)
   PRENATAL VISIT NOTE  Subjective:  Priscilla Sherman is a 37 y.o. [id number] at [redacted]w[redacted]d being seen today for ongoing prenatal care.  She is currently monitored for the following issues for this low-risk pregnancy and has Encounter for supervision of normal pregnancy, unspecified, unspecified trimester and Smoking on their problem list.  Patient reports no complaints.  Contractions: Not present. Vag. Bleeding: None.  Movement: Present. Denies leaking of fluid.   The following portions of the patient's history were reviewed and updated as appropriate: allergies, current medications, past family history, past medical history, past social history, past surgical history and problem list. Problem list updated.  Objective:   Vitals:   12/19/17 0826  BP: 111/72  Pulse: 85  Weight: 133 lb 9.6 oz (60.6 kg)    Fetal Status: Fetal Heart Rate (bpm): 138 Fundal Height: 30 cm Movement: Present     General:  Alert, oriented and cooperative. Patient is in no acute distress.  Skin: Skin is warm and dry. No rash noted.   Cardiovascular: Normal heart rate noted  Respiratory: Normal respiratory effort, no problems with respiration noted  Abdomen: Soft, gravid, appropriate for gestational age.  Pain/Pressure: Absent     Pelvic: Cervical exam deferred        Extremities: Normal range of motion.  Edema: Trace  Mental Status: Normal mood and affect. Normal behavior. Normal judgment and thought content.   Assessment and Plan:  Pregnancy: [id number] at [redacted]w[redacted]d  1. Encounter for supervision of normal pregnancy, antepartum, unspecified gravidity - No complaints. Routine care - GTT information reviewed  - Glucose Tolerance, 2 Hours w/1 Hour - CBC - RPR - HIV antibody  Preterm labor symptoms and general obstetric precautions including but not limited to vaginal bleeding, contractions, leaking of fluid and fetal movement were reviewed in detail with the patient. Please refer to After Visit Summary for other counseling  recommendations.  Return in about 2 weeks (around 01/02/2018) for Return OB visit.  Future Appointments  Date Time Provider Department Center  12/19/2017  9:10 AM Rolm Bookbinder, CNM [facility] None   Rolm Bookbinder, [place] 12/19/17 8:40 AM

## 2017-12-20 LAB — CBC
HEMOGLOBIN: 9.8 g/dL — AB (ref 11.1–15.9)
Hematocrit: 30 % — ABNORMAL LOW (ref 34.0–46.6)
MCH: 30.8 pg (ref 26.6–33.0)
MCHC: 32.7 g/dL (ref 31.5–35.7)
MCV: 94 fL (ref 79–97)
PLATELETS: 268 10*3/uL (ref 150–379)
RBC: 3.18 x10E6/uL — ABNORMAL LOW (ref 3.77–5.28)
RDW: 14.4 % (ref 12.3–15.4)
WBC: 6.4 10*3/uL (ref 3.4–10.8)

## 2017-12-20 LAB — HIV ANTIBODY (ROUTINE TESTING W REFLEX): HIV SCREEN 4TH GENERATION: NONREACTIVE

## 2017-12-20 LAB — GLUCOSE TOLERANCE, 2 HOURS W/ 1HR
GLUCOSE, 1 HOUR: 88 mg/dL (ref 65–179)
GLUCOSE, 2 HOUR: 85 mg/dL (ref 65–152)
Glucose, Fasting: 79 mg/dL (ref 65–91)

## 2017-12-20 LAB — RPR: RPR: NONREACTIVE

## 2017-12-21 ENCOUNTER — Inpatient Hospital Stay (HOSPITAL_COMMUNITY)
Admission: AD | Admit: 2017-12-21 | Discharge: 2017-12-21 | Disposition: A | Discharge status: Home / Self Care | Payer: Medicaid Other | Source: Ambulatory Visit | Attending: Obstetrics & Gynecology | Admitting: Obstetrics & Gynecology

## 2017-12-21 ENCOUNTER — Encounter (HOSPITAL_COMMUNITY): Payer: Self-pay | Admitting: *Deleted

## 2017-12-21 DIAGNOSIS — O99343 Other mental disorders complicating pregnancy, third trimester: Secondary | ICD-10-CM | POA: Diagnosis not present

## 2017-12-21 DIAGNOSIS — O479 False labor, unspecified: Secondary | ICD-10-CM

## 2017-12-21 DIAGNOSIS — F17210 Nicotine dependence, cigarettes, uncomplicated: Secondary | ICD-10-CM | POA: Diagnosis not present

## 2017-12-21 DIAGNOSIS — O4703 False labor before 37 completed weeks of gestation, third trimester: Secondary | ICD-10-CM | POA: Insufficient documentation

## 2017-12-21 DIAGNOSIS — Z3A30 30 weeks gestation of pregnancy: Secondary | ICD-10-CM

## 2017-12-21 DIAGNOSIS — Z809 Family history of malignant neoplasm, unspecified: Secondary | ICD-10-CM | POA: Insufficient documentation

## 2017-12-21 DIAGNOSIS — F17200 Nicotine dependence, unspecified, uncomplicated: Secondary | ICD-10-CM

## 2017-12-21 DIAGNOSIS — Z79899 Other long term (current) drug therapy: Secondary | ICD-10-CM | POA: Diagnosis not present

## 2017-12-21 DIAGNOSIS — G44201 Tension-type headache, unspecified, intractable: Secondary | ICD-10-CM

## 2017-12-21 DIAGNOSIS — O99333 Smoking (tobacco) complicating pregnancy, third trimester: Secondary | ICD-10-CM | POA: Diagnosis not present

## 2017-12-21 DIAGNOSIS — O26893 Other specified pregnancy related conditions, third trimester: Secondary | ICD-10-CM | POA: Diagnosis present

## 2017-12-21 DIAGNOSIS — R51 Headache: Secondary | ICD-10-CM | POA: Insufficient documentation

## 2017-12-21 DIAGNOSIS — Z3490 Encounter for supervision of normal pregnancy, unspecified, unspecified trimester: Secondary | ICD-10-CM

## 2017-12-21 LAB — URINALYSIS, ROUTINE W REFLEX MICROSCOPIC
BILIRUBIN URINE: NEGATIVE
GLUCOSE, UA: NEGATIVE mg/dL
Hgb urine dipstick: NEGATIVE
KETONES UR: NEGATIVE mg/dL
LEUKOCYTES UA: NEGATIVE
NITRITE: NEGATIVE
PH: 6.5 (ref 5.0–8.0)
PROTEIN: NEGATIVE mg/dL
Specific Gravity, Urine: <1.005 — ABNORMAL LOW (ref 1.005–1.030)

## 2017-12-21 LAB — COMPREHENSIVE METABOLIC PANEL
ALT: 21 U/L (ref 14–54)
AST: 33 U/L (ref 15–41)
Albumin: 3 g/dL — ABNORMAL LOW (ref 3.5–5.0)
Alkaline Phosphatase: 77 U/L (ref 38–126)
Anion gap: 9 (ref 5–15)
BUN: 5 mg/dL — ABNORMAL LOW (ref 6–20)
CO2: 17 mmol/L — ABNORMAL LOW (ref 22–32)
Calcium: 8.6 mg/dL — ABNORMAL LOW (ref 8.9–10.3)
Chloride: 106 mmol/L (ref 101–111)
Creatinine, Ser: 0.46 mg/dL (ref 0.44–1.00)
GFR calc Af Amer: >60 mL/min (ref >60)
GFR calc non Af Amer: >60 mL/min (ref >60)
Glucose, Bld: 96 mg/dL (ref 65–99)
Potassium: 4.7 mmol/L (ref 3.5–5.1)
Sodium: 132 mmol/L — ABNORMAL LOW (ref 135–145)
Total Bilirubin: 1 mg/dL (ref 0.3–1.2)
Total Protein: 6.1 g/dL — ABNORMAL LOW (ref 6.5–8.1)

## 2017-12-21 LAB — CBC
HCT: 30.1 % — ABNORMAL LOW (ref 36.0–46.0)
Hemoglobin: 10.2 g/dL — ABNORMAL LOW (ref 12.0–15.0)
MCH: 31.8 pg (ref 26.0–34.0)
MCHC: 33.9 g/dL (ref 30.0–36.0)
MCV: 93.8 fL (ref 78.0–100.0)
Platelets: 312 10*3/uL (ref 150–400)
RBC: 3.21 MIL/uL — ABNORMAL LOW (ref 3.87–5.11)
RDW: 14.1 % (ref 11.5–15.5)
WBC: 10.3 10*3/uL (ref 4.0–10.5)

## 2017-12-21 LAB — PROTEIN / CREATININE RATIO, URINE
Creatinine, Urine: 16 mg/dL
Total Protein, Urine: <6 mg/dL

## 2017-12-21 MED ORDER — METOCLOPRAMIDE HCL 5 MG/ML IJ SOLN
10.0000 mg | Freq: Once | INTRAMUSCULAR | Status: AC
  Administered 2017-12-21: 10 mg via INTRAVENOUS
  Filled 2017-12-21: qty 2

## 2017-12-21 MED ORDER — LACTATED RINGERS IV BOLUS
1000.0000 mL | Freq: Once | INTRAVENOUS | Status: AC
  Administered 2017-12-21: 1000 mL via INTRAVENOUS

## 2017-12-21 MED ORDER — DEXAMETHASONE SODIUM PHOSPHATE 10 MG/ML IJ SOLN
10.0000 mg | Freq: Once | INTRAMUSCULAR | Status: AC
  Administered 2017-12-21: 10 mg via INTRAVENOUS
  Filled 2017-12-21: qty 1

## 2017-12-21 MED ORDER — DIPHENHYDRAMINE HCL 50 MG/ML IJ SOLN
25.0000 mg | Freq: Once | INTRAMUSCULAR | Status: AC
  Administered 2017-12-21: 25 mg via INTRAVENOUS
  Filled 2017-12-21: qty 1

## 2017-12-21 MED ORDER — BUTALBITAL-APAP-CAFFEINE 50-325-40 MG PO TABS
1.0000 | ORAL_TABLET | Freq: Four times a day (QID) | ORAL | 0 refills | Status: DC | PRN
Start: 2017-12-21 — End: 2018-03-24

## 2017-12-21 NOTE — MAU Note (Signed)
Pt reports headache for  2 days, Went away yesterday, but comae back this morning. Took tylenol w/ no relief. No other complaints

## 2017-12-21 NOTE — MAU Note (Signed)
Pt reports HA since 12/13/22 after she had her sugar test. Pt states her father passed away Dec 13, 2022 as well. Feels like it could be stress related and does not normally get HAs. States she has been taking Tylenol-last took around 7pm. States it helped yesterday but has not helped today. Pt denies visual changes or RUQ pain. Denies contractions, vaginal bleeding or LOF. Reports good fetal movement.

## 2017-12-21 NOTE — MAU Provider Note (Signed)
Chief Complaint:  Headache   First Provider Initiated Contact with Patient 12/21/17 2151      HPI: Priscilla Sherman is a 37 y.o. W1X9147 at 48w4dwho presents to maternity admissions reporting headache for 2 days. Headache   This is a new problem. Episode onset: 31-Dec-2022 afternoon. The problem occurs constantly. The problem has been gradually worsening. The pain is located in the frontal region. The quality of the pain is described as aching. The pain is at a severity of 7/10. Associated symptoms include a fever. Pertinent negatives include no blurred vision, dizziness, visual change or weakness. The symptoms are aggravated by coughing and sneezing. She has tried acetaminophen and cold packs for the symptoms. The treatment provided mild relief.  She reports good fetal movement, denies LOF, vaginal bleeding, abdominal cramping/contractions, vaginal itching/burning, urinary symptoms. She denies visual changes, increased swelling. She reports her father passed away on Dec 31, 2022 and she believes her headache is caused from stress and limited sleep.   Past Medical History: Past Medical History:  Diagnosis Date  . Anxiety   . Chlamydia     Past obstetric history: OB History  Gravida Para Term Preterm AB Living  SAB TAB Ectopic Multiple Live Births    1   0 3    # Outcome Date GA Lbr Len/2nd Weight Sex Delivery Anes PTL Lv  5 Current           4 Term 01/29/16 [redacted]w[redacted]d 08:02 / 00:54 6 lb 13.4 oz (3.1 kg) F Vag-Spont EPI  LIV  3 Term 02/13/10 [redacted]w[redacted]d  7 lb 2 oz (3.232 kg)  Vag-Spont   LIV  2 Term 10/12/04 [redacted]w[redacted]d  8 lb (3.629 kg) F Vag-Spont   LIV  1 TAB             Past Surgical History: Past Surgical History:  Procedure Laterality Date  . NO PAST SURGERIES      Family History: Family History  Problem Relation Age of Onset  . Cancer Father     Social History: Social History   Tobacco Use  . Smoking status: Current Every Day Smoker    Packs/day: 0.10    Years: 10.00    Pack  years: 1.00    Types: Cigarettes  . Smokeless tobacco: Never Used  Substance Use Topics  . Alcohol use: Yes    Comment: socially  . Drug use: Yes    Types: Marijuana    Comment: last used 9 montha ago    Allergies: No Known Allergies  Meds:  Medications Prior to Admission  Medication Sig Dispense Refill Last Dose  . Prenatal Multivit-Min-Fe-FA (PRENATAL VITAMINS) 0.8 MG tablet Take 1 tablet by mouth daily. 30 tablet 12 Past Week at Unknown time  . calcium-vitamin D (OSCAL WITH D) 250-125 MG-UNIT tablet Take 1 tablet by mouth daily.   Taking  . Multiple Vitamins-Minerals (MULTIVITAMIN WITH MINERALS) tablet Take 1 tablet by mouth daily.   Not Taking  . ondansetron (ZOFRAN ODT) 4 MG disintegrating tablet Take 1 tablet (4 mg total) by mouth every 8 (eight) hours as needed for nausea or vomiting. (Patient not taking: Reported on 12/19/2017) 10 tablet 0 Not Taking  . Prenatal Vit-Fe Fumarate-FA (PREPLUS) 27-1 MG TABS TK 1 T PO  QD  12 Not Taking    ROS:  Review of Systems  Constitutional: Positive for chills and fever.  Eyes: Negative for blurred vision.  Respiratory: Negative.   Cardiovascular: Negative.  Gastrointestinal: Negative.   Genitourinary: Negative.   Neurological: Positive for headaches. Negative for dizziness, weakness and light-headedness.   I have reviewed patient's Past Medical Hx, Surgical Hx, Family Hx, Social Hx, medications and allergies.   Physical Exam   Patient Vitals for the past 24 hrs:  BP Temp Temp src Pulse Resp Height Weight  12/21/17 2135 - 99.6 F (37.6 C) - - 16 - -  12/21/17 2111 113/63 (!) 100.7 F (38.2 C) Oral 85 18  (1.575 m) 130 lb (59 kg)   Constitutional: Well-developed, well-nourished female in no acute distress.  Cardiovascular: normal rate Respiratory: normal effort GI: Abd soft, non-tender, gravid appropriate for gestational age.  MS: Extremities nontender, no edema, normal ROM Neurologic: Alert and oriented x 4.  CERVICAL  EXAM: Dilation: Closed Exam by:: V Aundria Rud  FHT:  Baseline 145 , moderate variability, accelerations present, no decelerations Contractions: q 3-5 mins/ mild by palpation- patient reports she does not feel contractions    Labs: Sodium 135 - 145 mmol/L 132Low    Potassium 3.5 - 5.1 mmol/L 4.7   Chloride 101 - 111 mmol/L 106   CO2 22 - 32 mmol/L 17Low    Glucose, Bld 65 - 99 mg/dL 96   BUN 6 - 20 mg/dL 5Low    Creatinine, Ser 0.44 - 1.00 mg/dL 1.88   Calcium 8.9 - 41.6 mg/dL 6.0YTK    Total Protein 6.5 - 8.1 g/dL 1.6WFU    Albumin 3.5 - 5.0 g/dL 9.3ATF    AST 15 - 41 U/L 33   ALT 14 - 54 U/L 21   Alkaline Phosphatase 38 - 126 U/L 77   Total Bilirubin 0.3 - 1.2 mg/dL 1.0   GFR calc non Af Amer >60 mL/min >60   GFR calc Af Amer >60 mL/min >60    WBC 4.0 - 10.5 K/uL 10.3   RBC 3.87 - 5.11 MIL/uL 3.21Low    Hemoglobin 12.0 - 15.0 g/dL 57.3UKG    HCT 25.4 - 27.0 % 30.1Low    MCV 78.0 - 100.0 fL 93.8   MCH 26.0 - 34.0 pg 31.8   MCHC 30.0 - 36.0 g/dL 62.3   RDW 76.2 - 83.1 % 14.1   Platelets 150 - 400 K/uL 312    Creatinine, Urine mg/dL 51.76   Total Protein, Urine mg/dL <1.60   Comment: REPEATED TO VERIFY  Protein Creatinine Ratio 0.00 - 0.15 mg/mg      Comment: RESULT BELOW REPORTABLE RANGE,  UNABLE TO CALCULATE.  Performed at [facility], [address], [place], [place] [address]    MAU Course/MDM: Orders Placed This Encounter  Procedures  . Urinalysis, Routine w reflex microscopic  . CBC  . Comprehensive metabolic panel  . Protein / creatinine ratio, urine  . Insert peripheral IV   UA- negative  Pre E labs - negative   Meds ordered this encounter  Medications  . AND Linked Order Group   . diphenhydrAMINE (BENADRYL) injection 25 mg   . metoCLOPramide (REGLAN) injection 10 mg   . dexamethasone (DECADRON) injection 10 mg  . lactated ringers bolus 1,000 mL  . butalbital-acetaminophen-caffeine (FIORICET, ESGIC) 50-325-40 MG tablet    Sig: Take 1 tablet  by mouth every 6 (six) hours as needed for headache.    Dispense:  20 tablet    Refill:  0    Order Specific Question:   Supervising Provider    Answer:   Tilda Burrow [[id number]]   NST reviewed- reactive for gestational  age   Treatments in MAU included LR bolus IV with headache cocktail including benadryl , decadron  and reglan . Patient reports significant decrease in headache with medication. Reports pain being 2/10. Patient stable prior to discharge. Rx for Fioricet sent to pharmacy of choice.    Pt discharge.   Assessment: 1. Acute intractable tension-type headache   2. Braxton Hicks contractions     Plan: Discharge home PTL precautions and fetal kick counts Follow up as scheduled in the office Return to MAU as needed  Rx for Fioricet    Allergies as of 12/21/2017   No Known Allergies     Medication List    TAKE these medications   butalbital-acetaminophen-caffeine 50-325-40 MG tablet Commonly known as:  FIORICET, ESGIC Take 1 tablet by mouth every 6 (six) hours as needed for headache.   calcium-vitamin D 250-125 MG-UNIT tablet Commonly known as:  OSCAL WITH D Take 1 tablet by mouth daily.   multivitamin with minerals tablet Take 1 tablet by mouth daily.   ondansetron 4 MG disintegrating tablet Commonly known as:  ZOFRAN ODT Take 1 tablet (4 mg total) by mouth every 8 (eight) hours as needed for nausea or vomiting.   Prenatal Vitamins 0.8 MG tablet Take 1 tablet by mouth daily.   PREPLUS 27-1 MG Tabs TK 1 T PO  QD       Steward Drone Certified Nurse-Midwife 12/21/2017 11:59 PM

## 2018-01-02 ENCOUNTER — Encounter: Payer: Self-pay | Admitting: Student

## 2018-01-02 ENCOUNTER — Ambulatory Visit (INDEPENDENT_AMBULATORY_CARE_PROVIDER_SITE_OTHER): Payer: Medicaid Other | Admitting: Student

## 2018-01-02 DIAGNOSIS — Z3490 Encounter for supervision of normal pregnancy, unspecified, unspecified trimester: Secondary | ICD-10-CM

## 2018-01-02 NOTE — Progress Notes (Signed)
Pt states she had a MAU visit 2 weeks ago for a headache has been better since.  Pt has no concerns today.

## 2018-01-02 NOTE — Patient Instructions (Signed)

## 2018-01-02 NOTE — Progress Notes (Signed)
Patient ID: Priscilla Sherman, female   DOB: Oct 21, 1980, 37 y.o.   MRN: [id number]   PRENATAL VISIT NOTE  Subjective:  Priscilla Sherman is a 37 y.o. [id number] at 3091w2d being seen today for ongoing prenatal care.  She is currently monitored for the following issues for this low-risk pregnancy and has Encounter for supervision of normal pregnancy, unspecified, unspecified trimester and Smoking on their problem list.  Patient reports had a bit of a headache yesterday.. Was seen in MAU for HA on 12-21-2017; HA resolved with HA cocktail. Patient has been taking iron once a day after she was told it might help with her headaches.  Contractions: Irritability. Vag. Bleeding: None.  Movement: Present. Denies leaking of fluid.   The following portions of the patient's history were reviewed and updated as appropriate: allergies, current medications, past family history, past medical history, past social history, past surgical history and problem list. Problem list updated.  Objective:   Vitals:   01/02/18 0953  BP: 106/69  Pulse: 80  Weight: 128 lb 8 oz (58.3 kg)    Fetal Status: Fetal Heart Rate (bpm): 156 Fundal Height: 32 cm Movement: Present     General:  Alert, oriented and cooperative. Patient is in no acute distress.  Skin: Skin is warm and dry. No rash noted.   Cardiovascular: Normal heart rate noted  Respiratory: Normal respiratory effort, no problems with respiration noted  Abdomen: Soft, gravid, appropriate for gestational age.  Pain/Pressure: Absent     Pelvic: Cervical exam deferred        Extremities: Normal range of motion.  Edema: None  Mental Status: Normal mood and affect. Normal behavior. Normal judgment and thought content.   Assessment and Plan:  Pregnancy: G5P3013 at 4891w2d  1. Encounter for supervision of normal pregnancy, antepartum, unspecified gravidity -Doing well; tolerating Iron. Patient feeling a little weak in the heat; recommended she take iron twice a day and also increase  iron-rich foods. Patient likes spinach, will be easy to add that to her diet.   2. Signed BTL papers today, although she is still weighing her options.    Preterm labor symptoms and general obstetric precautions including but not limited to vaginal bleeding, contractions, leaking of fluid and fetal movement were reviewed in detail with the patient. Please refer to After Visit Summary for other counseling recommendations.  Return in about 2 weeks (around 01/16/2018).  Future Appointments  Date Time Provider Department Center  01/16/2018  9:50 AM Sharyon Cableogers, Veronica C, CNM [facility] None    Charlesetta GaribaldiKathryn Lorraine [place], [place]

## 2018-01-16 ENCOUNTER — Encounter: Payer: Self-pay | Admitting: *Deleted

## 2018-01-16 ENCOUNTER — Encounter: Payer: Self-pay | Admitting: Certified Nurse Midwife

## 2018-01-16 ENCOUNTER — Ambulatory Visit (INDEPENDENT_AMBULATORY_CARE_PROVIDER_SITE_OTHER): Payer: Medicaid Other | Admitting: Certified Nurse Midwife

## 2018-01-16 VITALS — BP 109/69 | HR 80 | Wt 129.6 lb

## 2018-01-16 DIAGNOSIS — O09523 Supervision of elderly multigravida, third trimester: Secondary | ICD-10-CM

## 2018-01-16 DIAGNOSIS — O99613 Diseases of the digestive system complicating pregnancy, third trimester: Secondary | ICD-10-CM

## 2018-01-16 DIAGNOSIS — Z3490 Encounter for supervision of normal pregnancy, unspecified, unspecified trimester: Secondary | ICD-10-CM

## 2018-01-16 DIAGNOSIS — K5900 Constipation, unspecified: Secondary | ICD-10-CM

## 2018-01-16 DIAGNOSIS — Z3493 Encounter for supervision of normal pregnancy, unspecified, third trimester: Secondary | ICD-10-CM

## 2018-01-16 MED ORDER — DOCUSATE SODIUM 100 MG PO CAPS: 100.0000 mg | ORAL_CAPSULE | Freq: Two times a day (BID) | ORAL | 0 refills | Status: DC

## 2018-01-16 NOTE — Patient Instructions (Signed)
Constipation, Adult Constipation is when a person:  Poops (has a bowel movement) fewer times in a week than normal.  Has a hard time pooping.  Has poop that is dry, hard, or bigger than normal.  Follow these instructions at home: Eating and drinking   Eat foods that have a lot of fiber, such as: ? Fresh fruits and vegetables. ? Whole grains. ? Beans.  Eat less of foods that are high in fat, low in fiber, or overly processed, such as: ? [place] fries. ? Hamburgers. ? Cookies. ? Candy. ? Soda.  Drink enough fluid to keep your pee (urine) clear or pale yellow. General instructions  Exercise regularly or as told by your doctor.  Go to the restroom when you feel like you need to poop. Do not hold it in.  Take over-the-counter and prescription medicines only as told by your doctor. These include any fiber supplements.  Do pelvic floor retraining exercises, such as: ? Doing deep breathing while relaxing your lower belly (abdomen). ? Relaxing your pelvic floor while pooping.  Watch your condition for any changes.  Keep all follow-up visits as told by your doctor. This is important. Contact a doctor if:  You have pain that gets worse.  You have a fever.  You have not pooped for 4 days.  You throw up (vomit).  You are not hungry.  You lose weight.  You are bleeding from the anus.  You have thin, pencil-like poop (stool). Get help right away if:  You have a fever, and your symptoms suddenly get worse.  You leak poop or have blood in your poop.  Your belly feels hard or bigger than normal (is bloated).  You have very bad belly pain.  You feel dizzy or you faint. This information is not intended to replace advice given to you by your health care provider. Make sure you discuss any questions you have with your health care provider. Document Released: 01/08/2008 Document Revised: 02/09/2016 Document Reviewed: 01/10/2016 Elsevier Interactive Patient Education   2018 [employer]. Pregnancy and Anemia Anemia is a condition in which the concentration of red blood cells or hemoglobin in the blood is below normal. Hemoglobin is a substance in red blood cells that carries oxygen to the tissues of the body. Anemia results in not enough oxygen reaching these tissues. Anemia during pregnancy is common because the fetus uses more iron and folic acid as it is developing. Your body may not produce enough red blood cells because of this. Also, during pregnancy, the liquid part of the blood (plasma) increases by about 50%, and the red blood cells increase by only 25%. This lowers the concentration of the red blood cells and creates a natural anemia-like situation. What are the causes? The most common cause of anemia during pregnancy is not having enough iron in the body to make red blood cells (iron deficiency anemia). Other causes may include:  Folic acid deficiency.  Vitamin B12 deficiency.  Certain prescription or over-the-counter medicines.  Certain medical conditions or infections that destroy red blood cells.  A low platelet count and bleeding caused by antibodies that go through the placenta to the fetus from the mother's blood.  What are the signs or symptoms? Mild anemia may not be noticeable. If it becomes severe, symptoms may include:  Tiredness.  Shortness of breath, especially with exercise.  Weakness.  Fainting.  Pale looking skin.  Headaches.  Feeling a fast or irregular heartbeat (palpitations).  How is this diagnosed? The  type of anemia is usually diagnosed from your family and medical history and blood tests. How is this treated? Treatment of anemia during pregnancy depends on the cause of the anemia. Treatment can include:  Supplements of iron, vitamin B12, or folic acid.  A blood transfusion. This may be needed if blood loss is severe.  Hospitalization. This may be needed if there is significant continual blood  loss.  Dietary changes.  Follow these instructions at home:  Follow your dietitian's or health care provider's dietary recommendations.  Increase your vitamin C intake. This will help the stomach absorb more iron.  Eat a diet rich in iron. This would include foods such as: ? Liver. ? Beef. ? Whole grain bread. ? Eggs. ? Dried fruit.  Take iron and vitamins as directed by your health care provider.  Eat green leafy vegetables. These are a good source of folic acid. Contact a health care provider if:  You have frequent or lasting headaches.  You are looking pale.  You are bruising easily. Get help right away if:  You have extreme weakness, shortness of breath, or chest pain.  You become dizzy or have trouble concentrating.  You have heavy vaginal bleeding.  You develop a rash.  You have bloody or black, tarry stools.  You faint.  You vomit up blood.  You vomit repeatedly.  You have abdominal pain.  You have a fever or persistent symptoms for more than 2-3 days.  You have a fever and your symptoms suddenly get worse.  You are dehydrated. This information is not intended to replace advice given to you by your health care provider. Make sure you discuss any questions you have with your health care provider. Document Released: 07/19/2000 Document Revised: 12/28/2015 Document Reviewed: 03/03/2013 Elsevier Interactive Patient Education  2017 [employer]

## 2018-01-16 NOTE — Progress Notes (Signed)
   PRENATAL VISIT NOTE  Subjective:  Priscilla Sherman is a 37 y.o. [id number] at 4067w2d being seen today for ongoing prenatal care.  She is currently monitored for the following issues for this low-risk pregnancy and has Encounter for supervision of normal pregnancy, unspecified, unspecified trimester; Smoking; and AMA (advanced maternal age) multigravida 35+, third trimester on their problem list.  Patient reports constipation . She reports being constipated since stating her iron supplements. Last BM yesterday but took Milk of Magnesia to be able to use restroom.   Contractions: Irregular. Vag. Bleeding: None.  Movement: Present. Denies leaking of fluid.   The following portions of the patient's history were reviewed and updated as appropriate: allergies, current medications, past family history, past medical history, past social history, past surgical history and problem list. Problem list updated.  Objective:   Vitals:   01/16/18 1006  BP: 109/69  Pulse: 80  Weight: 129 lb 9.6 oz (58.8 kg)    Fetal Status: Fetal Heart Rate (bpm): 154 Fundal Height: 33 cm Movement: Present  Presentation: Vertex  General:  Alert, oriented and cooperative. Patient is in no acute distress.  Skin: Skin is warm and dry. No rash noted.   Cardiovascular: Normal heart rate noted  Respiratory: Normal respiratory effort, no problems with respiration noted  Abdomen: Soft, gravid, appropriate for gestational age.  Pain/Pressure: Absent     Pelvic: Cervical exam deferred        Extremities: Normal range of motion.  Edema: None  Mental Status: Normal mood and affect. Normal behavior. Normal judgment and thought content.   Assessment and Plan:  Pregnancy: [id number] at 7467w2d  1. Encounter for supervision of normal pregnancy, antepartum, unspecified gravidity -patient doing well  -anticipatory guidance on upcoming appointments   2. Constipation during pregnancy in third trimester -Educated and discussed constipation  during pregnancy with iron supplementation. -Discussed medication to help prevention of constipation and foods with high fiber.  - docusate sodium (COLACE) 100 MG capsule; Take 1 capsule (100 mg total) by mouth 2 (two) times daily.  Dispense: 60 capsule; Refill: 0  3. AMA (advanced maternal age) multigravida 35+, third trimester  Preterm labor symptoms and general obstetric precautions including but not limited to vaginal bleeding, contractions, leaking of fluid and fetal movement were reviewed in detail with the patient. Please refer to After Visit Summary for other counseling recommendations.  Return in about 2 weeks (around 01/30/2018) for ROB.  Future Appointments  Date Time Provider Department Center  01/30/2018 11:15 AM Roe Coombsenney, Rachelle A, CNM [facility] None    Sharyon CableVeronica C Rayleen Wyrick, CNM

## 2018-01-16 NOTE — Progress Notes (Signed)
Patient reports good fetal movement with some irregular contractions. Pt re-signed BTL

## 2018-01-30 ENCOUNTER — Encounter: Payer: Self-pay | Admitting: Certified Nurse Midwife

## 2018-01-30 ENCOUNTER — Other Ambulatory Visit (HOSPITAL_COMMUNITY)
Admission: RE | Admit: 2018-01-30 | Discharge: 2018-01-30 | Disposition: A | Discharge status: Home / Self Care | Payer: Medicaid Other | Source: Ambulatory Visit | Attending: Certified Nurse Midwife | Admitting: Certified Nurse Midwife

## 2018-01-30 ENCOUNTER — Ambulatory Visit (INDEPENDENT_AMBULATORY_CARE_PROVIDER_SITE_OTHER): Payer: Medicaid Other | Admitting: Certified Nurse Midwife

## 2018-01-30 VITALS — BP 107/70 | HR 77 | Wt 130.2 lb

## 2018-01-30 DIAGNOSIS — Z3A36 36 weeks gestation of pregnancy: Secondary | ICD-10-CM | POA: Insufficient documentation

## 2018-01-30 DIAGNOSIS — O26893 Other specified pregnancy related conditions, third trimester: Secondary | ICD-10-CM | POA: Insufficient documentation

## 2018-01-30 DIAGNOSIS — N898 Other specified noninflammatory disorders of vagina: Secondary | ICD-10-CM

## 2018-01-30 DIAGNOSIS — O09523 Supervision of elderly multigravida, third trimester: Secondary | ICD-10-CM

## 2018-01-30 DIAGNOSIS — Z3490 Encounter for supervision of normal pregnancy, unspecified, unspecified trimester: Secondary | ICD-10-CM

## 2018-01-30 LAB — OB RESULTS CONSOLE GC/CHLAMYDIA: Gonorrhea: NEGATIVE

## 2018-01-30 NOTE — Progress Notes (Signed)
   PRENATAL VISIT NOTE  Subjective:  Priscilla Sherman is a 37 y.o. [id number] at 3667w2d being seen today for ongoing prenatal care.  She is currently monitored for the following issues for this low-risk pregnancy and has Encounter for supervision of normal pregnancy, unspecified, unspecified trimester; Smoking; and AMA (advanced maternal age) multigravida 35+, third trimester on their problem list.  Patient reports no bleeding, no leaking, occasional contractions and vaginal irritation.  Contractions: Not present. Vag. Bleeding: None.  Movement: Present. Denies leaking of fluid.   The following portions of the patient's history were reviewed and updated as appropriate: allergies, current medications, past family history, past medical history, past social history, past surgical history and problem list. Problem list updated.  Objective:   Vitals:   01/30/18 1122  BP: 107/70  Pulse: 77  Weight: 130 lb 3.2 oz (59.1 kg)    Fetal Status: Fetal Heart Rate (bpm): 147; doppler Fundal Height: 35 cm Movement: Present  Presentation: Vertex  General:  Alert, oriented and cooperative. Patient is in no acute distress.  Skin: Skin is warm and dry. No rash noted.   Cardiovascular: Normal heart rate noted  Respiratory: Normal respiratory effort, no problems with respiration noted  Abdomen: Soft, gravid, appropriate for gestational age.  Pain/Pressure: Absent     Pelvic: Cervical exam performed Dilation: Closed Effacement (%): 20 Station: Ballotable  Extremities: Normal range of motion.  Edema: None  Mental Status: Normal mood and affect. Normal behavior. Normal judgment and thought content.   Assessment and Plan:  Pregnancy: [id number] at 4767w2d  1. Encounter for supervision of normal pregnancy, antepartum, unspecified gravidity     Reports vaginal discharge has increased, denies LOF.   - Strep Gp B NAA - Cervicovaginal ancillary only  2. AMA (advanced maternal age) multigravida 35+, third trimester     <40  years.    3. Vaginal discharge      - Cervicovaginal ancillary only  Preterm labor symptoms and general obstetric precautions including but not limited to vaginal bleeding, contractions, leaking of fluid and fetal movement were reviewed in detail with the patient. Please refer to After Visit Summary for other counseling recommendations.  Return in about 1 week (around 02/06/2018) for ROB.  No future appointments.  Roe Coombsachelle A Kailin Leu, CNM

## 2018-02-01 LAB — STREP GP B NAA: Strep Gp B NAA: POSITIVE — AB

## 2018-02-03 LAB — GC/CHLAMYDIA PROBE AMP (~~LOC~~) NOT AT ARMC
CHLAMYDIA, DNA PROBE: NEGATIVE
Neisseria Gonorrhea: NEGATIVE

## 2018-02-04 ENCOUNTER — Ambulatory Visit (INDEPENDENT_AMBULATORY_CARE_PROVIDER_SITE_OTHER): Payer: Medicaid Other | Admitting: Certified Nurse Midwife

## 2018-02-04 ENCOUNTER — Encounter: Payer: Self-pay | Admitting: Certified Nurse Midwife

## 2018-02-04 VITALS — BP 107/70 | HR 91 | Wt 129.8 lb

## 2018-02-04 DIAGNOSIS — O09523 Supervision of elderly multigravida, third trimester: Secondary | ICD-10-CM

## 2018-02-04 DIAGNOSIS — Z3490 Encounter for supervision of normal pregnancy, unspecified, unspecified trimester: Secondary | ICD-10-CM

## 2018-02-04 DIAGNOSIS — B951 Streptococcus, group B, as the cause of diseases classified elsewhere: Secondary | ICD-10-CM

## 2018-02-04 NOTE — Progress Notes (Signed)
   PRENATAL VISIT NOTE  Subjective:  Priscilla Sherman is a 37 y.o. [id number] at 7197w0d being seen today for ongoing prenatal care.  She is currently monitored for the following issues for this low-risk pregnancy and has Encounter for supervision of normal pregnancy, unspecified, unspecified trimester; Smoking; AMA (advanced maternal age) multigravida 35+, third trimester; and Positive GBS test on their problem list.  Patient reports no complaints.  Contractions: Not present. Vag. Bleeding: None.  Movement: Present. Denies leaking of fluid.   The following portions of the patient's history were reviewed and updated as appropriate: allergies, current medications, past family history, past medical history, past social history, past surgical history and problem list. Problem list updated.  Objective:   Vitals:   02/04/18 1124  BP: 107/70  Pulse: 91  Weight: 129 lb 12.8 oz (58.9 kg)    Fetal Status: Fetal Heart Rate (bpm): 149; doppler Fundal Height: 36 cm Movement: Present     General:  Alert, oriented and cooperative. Patient is in no acute distress.  Skin: Skin is warm and dry. No rash noted.   Cardiovascular: Normal heart rate noted  Respiratory: Normal respiratory effort, no problems with respiration noted  Abdomen: Soft, gravid, appropriate for gestational age.  Pain/Pressure: Absent     Pelvic: Cervical exam deferred        Extremities: Normal range of motion.  Edema: None  Mental Status: Normal mood and affect. Normal behavior. Normal judgment and thought content.   Assessment and Plan:  Pregnancy: [id number] at 6197w0d  1. Encounter for supervision of normal pregnancy, antepartum, unspecified gravidity     Doing well.    2. AMA (advanced maternal age) multigravida 35+, third trimester     <40 years  3. Positive GBS test    PCN for labor/delivery  Term labor symptoms and general obstetric precautions including but not limited to vaginal bleeding, contractions, leaking of fluid and  fetal movement were reviewed in detail with the patient. Please refer to After Visit Summary for other counseling recommendations.  Return in about 1 week (around 02/11/2018) for ROB.  No future appointments.  Roe Coombsachelle A Eloyce Bultman, CNM

## 2018-02-09 ENCOUNTER — Telehealth: Payer: Self-pay

## 2018-02-09 NOTE — Telephone Encounter (Signed)
Returned telephone call to patient, she wanted RX for her Group B Beta Strep.  Advised patient that she will be treated when she goes in labour at the Hospital.  She verbalized understanding.

## 2018-02-11 ENCOUNTER — Other Ambulatory Visit: Payer: Self-pay | Admitting: Certified Nurse Midwife

## 2018-02-12 ENCOUNTER — Encounter: Payer: Self-pay | Admitting: Certified Nurse Midwife

## 2018-02-12 ENCOUNTER — Ambulatory Visit (INDEPENDENT_AMBULATORY_CARE_PROVIDER_SITE_OTHER): Payer: Medicaid Other | Admitting: Certified Nurse Midwife

## 2018-02-12 VITALS — BP 110/73 | HR 76 | Wt 132.0 lb

## 2018-02-12 DIAGNOSIS — Z3490 Encounter for supervision of normal pregnancy, unspecified, unspecified trimester: Secondary | ICD-10-CM

## 2018-02-12 DIAGNOSIS — O09523 Supervision of elderly multigravida, third trimester: Secondary | ICD-10-CM

## 2018-02-12 DIAGNOSIS — B951 Streptococcus, group B, as the cause of diseases classified elsewhere: Secondary | ICD-10-CM

## 2018-02-12 NOTE — Progress Notes (Signed)
   PRENATAL VISIT NOTE  Subjective:  Priscilla Sherman is a 37 y.o. [id number] at 4895w1d being seen today for ongoing prenatal care.  She is currently monitored for the following issues for this low-risk pregnancy and has Encounter for supervision of normal pregnancy, unspecified, unspecified trimester; Smoking; AMA (advanced maternal age) multigravida 35+, third trimester; and Positive GBS test on their problem list.  Patient reports no complaints.  Contractions: Not present. Vag. Bleeding: None.  Movement: Present. Denies leaking of fluid.   The following portions of the patient's history were reviewed and updated as appropriate: allergies, current medications, past family history, past medical history, past social history, past surgical history and problem list. Problem list updated.  Objective:   Vitals:   02/12/18 1119  BP: 110/73  Pulse: 76  Weight: 132 lb (59.9 kg)    Fetal Status: Fetal Heart Rate (bpm): 142; doppler Fundal Height: 36 cm Movement: Present     General:  Alert, oriented and cooperative. Patient is in no acute distress.  Skin: Skin is warm and dry. No rash noted.   Cardiovascular: Normal heart rate noted  Respiratory: Normal respiratory effort, no problems with respiration noted  Abdomen: Soft, gravid, appropriate for gestational age.  Pain/Pressure: Absent     Pelvic: Cervical exam deferred        Extremities: Normal range of motion.  Edema: None  Mental Status: Normal mood and affect. Normal behavior. Normal judgment and thought content.   Assessment and Plan:  Pregnancy: [id number] at 6895w1d  1. Encounter for supervision of normal pregnancy, antepartum, unspecified gravidity     Doing well.   2. Positive GBS test     PCN for labor/delivery  3. AMA (advanced maternal age) multigravida 35+, third trimester     <40 years  Term labor symptoms and general obstetric precautions including but not limited to vaginal bleeding, contractions, leaking of fluid and fetal  movement were reviewed in detail with the patient. Please refer to After Visit Summary for other counseling recommendations.  Return in about 1 week (around 02/19/2018) for ROB, schedule IOL for 41 weeks.  No future appointments.  Roe Coombsachelle A Denney, CNM

## 2018-02-12 NOTE — Progress Notes (Signed)
Patient reports good fetal movement, denies pain. 

## 2018-02-19 ENCOUNTER — Telehealth (HOSPITAL_COMMUNITY): Payer: Self-pay | Admitting: *Deleted

## 2018-02-19 ENCOUNTER — Encounter: Payer: Self-pay | Admitting: Obstetrics

## 2018-02-19 ENCOUNTER — Ambulatory Visit (INDEPENDENT_AMBULATORY_CARE_PROVIDER_SITE_OTHER): Payer: Medicaid Other | Admitting: Obstetrics

## 2018-02-19 ENCOUNTER — Encounter (HOSPITAL_COMMUNITY): Payer: Self-pay | Admitting: *Deleted

## 2018-02-19 VITALS — BP 109/72 | HR 76 | Wt 133.6 lb

## 2018-02-19 DIAGNOSIS — Z3490 Encounter for supervision of normal pregnancy, unspecified, unspecified trimester: Secondary | ICD-10-CM

## 2018-02-19 DIAGNOSIS — B951 Streptococcus, group B, as the cause of diseases classified elsewhere: Secondary | ICD-10-CM

## 2018-02-19 DIAGNOSIS — Z3493 Encounter for supervision of normal pregnancy, unspecified, third trimester: Secondary | ICD-10-CM

## 2018-02-19 DIAGNOSIS — O09523 Supervision of elderly multigravida, third trimester: Secondary | ICD-10-CM

## 2018-02-19 DIAGNOSIS — F17200 Nicotine dependence, unspecified, uncomplicated: Secondary | ICD-10-CM

## 2018-02-19 NOTE — Progress Notes (Signed)
Subjective:  Priscilla Sherman is a 37 y.o. [id number] at 57106w1d being seen today for ongoing prenatal care.  She is currently monitored for the following issues for this low-risk pregnancy and has Encounter for supervision of normal pregnancy, unspecified, unspecified trimester; Smoking; AMA (advanced maternal age) multigravida 35+, third trimester; and Positive GBS test on their problem list.  Patient reports no complaints.  Contractions: Not present. Vag. Bleeding: None.  Movement: Present. Denies leaking of fluid.   The following portions of the patient's history were reviewed and updated as appropriate: allergies, current medications, past family history, past medical history, past social history, past surgical history and problem list. Problem list updated.  Objective:   Vitals:   02/19/18 1057  BP: 109/72  Pulse: 76  Weight: 133 lb 9.6 oz (60.6 kg)    Fetal Status:     Movement: Present     General:  Alert, oriented and cooperative. Patient is in no acute distress.  Skin: Skin is warm and dry. No rash noted.   Cardiovascular: Normal heart rate noted  Respiratory: Normal respiratory effort, no problems with respiration noted  Abdomen: Soft, gravid, appropriate for gestational age. Pain/Pressure: Absent     Pelvic:  Cervical exam deferred        Extremities: Normal range of motion.  Edema: None  Mental Status: Normal mood and affect. Normal behavior. Normal judgment and thought content.   Urinalysis:      Assessment and Plan:  Pregnancy: [id number] at 6106w1d  1. Encounter for supervision of normal pregnancy, antepartum, unspecified gravidity  2. AMA (advanced maternal age) multigravida 35+, third trimester  3. Positive GBS test - TREAT IN LABOR  4. Smoking   Term labor symptoms and general obstetric precautions including but not limited to vaginal bleeding, contractions, leaking of fluid and fetal movement were reviewed in detail with the patient. Please refer to After Visit Summary  for other counseling recommendations.  Return in about 1 week (around 02/26/2018) for ROB.   Brock BadHarper, Charles A, MD

## 2018-02-19 NOTE — Progress Notes (Signed)
Patient reports good fetal movement, denies pain. 

## 2018-02-19 NOTE — Telephone Encounter (Signed)
Preadmission screen  

## 2018-02-24 ENCOUNTER — Encounter (HOSPITAL_COMMUNITY): Payer: Self-pay | Admitting: *Deleted

## 2018-02-24 ENCOUNTER — Inpatient Hospital Stay (HOSPITAL_COMMUNITY)
Admission: AD | Admit: 2018-02-24 | Discharge: 2018-02-25 | DRG: 807 | Disposition: A | Discharge status: Home / Self Care | Payer: Medicaid Other | Attending: Family Medicine | Admitting: Family Medicine

## 2018-02-24 ENCOUNTER — Other Ambulatory Visit: Payer: Self-pay

## 2018-02-24 ENCOUNTER — Inpatient Hospital Stay (HOSPITAL_COMMUNITY): Payer: Medicaid Other | Admitting: Anesthesiology

## 2018-02-24 DIAGNOSIS — O99334 Smoking (tobacco) complicating childbirth: Secondary | ICD-10-CM | POA: Diagnosis present

## 2018-02-24 DIAGNOSIS — F17210 Nicotine dependence, cigarettes, uncomplicated: Secondary | ICD-10-CM | POA: Diagnosis present

## 2018-02-24 DIAGNOSIS — O99824 Streptococcus B carrier state complicating childbirth: Principal | ICD-10-CM | POA: Diagnosis present

## 2018-02-24 DIAGNOSIS — O09523 Supervision of elderly multigravida, third trimester: Secondary | ICD-10-CM

## 2018-02-24 DIAGNOSIS — Z3483 Encounter for supervision of other normal pregnancy, third trimester: Secondary | ICD-10-CM | POA: Diagnosis present

## 2018-02-24 DIAGNOSIS — F17200 Nicotine dependence, unspecified, uncomplicated: Secondary | ICD-10-CM

## 2018-02-24 DIAGNOSIS — Z3A39 39 weeks gestation of pregnancy: Secondary | ICD-10-CM

## 2018-02-24 DIAGNOSIS — Z3490 Encounter for supervision of normal pregnancy, unspecified, unspecified trimester: Secondary | ICD-10-CM

## 2018-02-24 LAB — TYPE AND SCREEN
ABO/RH(D): B POS
Antibody Screen: NEGATIVE

## 2018-02-24 LAB — CBC
HCT: 34.3 % — ABNORMAL LOW (ref 36.0–46.0)
HEMOGLOBIN: 11.8 g/dL — AB (ref 12.0–15.0)
MCH: 31.7 pg (ref 26.0–34.0)
MCHC: 34.4 g/dL (ref 30.0–36.0)
MCV: 92.2 fL (ref 78.0–100.0)
PLATELETS: 348 10*3/uL (ref 150–400)
RBC: 3.72 MIL/uL — AB (ref 3.87–5.11)
RDW: 14.5 % (ref 11.5–15.5)
WBC: 11.5 10*3/uL — AB (ref 4.0–10.5)

## 2018-02-24 LAB — RPR: RPR: NONREACTIVE

## 2018-02-24 LAB — POCT FERN TEST: POCT FERN TEST: POSITIVE

## 2018-02-24 MED ORDER — OXYCODONE-ACETAMINOPHEN 5-325 MG PO TABS: 2.0000 | ORAL_TABLET | ORAL | Status: DC | PRN

## 2018-02-24 MED ORDER — FLEET ENEMA 7-19 GM/118ML RE ENEM: 1.0000 | ENEMA | RECTAL | Status: DC | PRN

## 2018-02-24 MED ORDER — ONDANSETRON HCL 4 MG/2ML IJ SOLN: 4.0000 mg | INTRAMUSCULAR | Status: DC | PRN

## 2018-02-24 MED ORDER — PHENYLEPHRINE 40 MCG/ML (10ML) SYRINGE FOR IV PUSH (FOR BLOOD PRESSURE SUPPORT)
80.0000 ug | PREFILLED_SYRINGE | INTRAVENOUS | Status: DC | PRN
  Filled 2018-02-24: qty 5

## 2018-02-24 MED ORDER — OXYCODONE-ACETAMINOPHEN 5-325 MG PO TABS: 1.0000 | ORAL_TABLET | ORAL | Status: DC | PRN

## 2018-02-24 MED ORDER — DIBUCAINE 1 % RE OINT: 1.0000 "application " | TOPICAL_OINTMENT | RECTAL | Status: DC | PRN

## 2018-02-24 MED ORDER — FENTANYL 2.5 MCG/ML BUPIVACAINE 1/10 % EPIDURAL INFUSION (WH - ANES)
14.0000 mL/h | INTRAMUSCULAR | Status: DC | PRN
  Administered 2018-02-24: 14 mL/h via EPIDURAL
  Filled 2018-02-24: qty 100

## 2018-02-24 MED ORDER — OXYTOCIN BOLUS FROM INFUSION
500.0000 mL | Freq: Once | INTRAVENOUS | Status: AC
  Administered 2018-02-24: 500 mL via INTRAVENOUS

## 2018-02-24 MED ORDER — IBUPROFEN 600 MG PO TABS
600.0000 mg | ORAL_TABLET | Freq: Four times a day (QID) | ORAL | Status: DC
  Administered 2018-02-24 – 2018-02-25 (×4): 600 mg via ORAL
  Filled 2018-02-24 (×4): qty 1

## 2018-02-24 MED ORDER — SODIUM CHLORIDE 0.9 % IV SOLN
5.0000 10*6.[IU] | Freq: Once | INTRAVENOUS | Status: AC
  Administered 2018-02-24: 5 10*6.[IU] via INTRAVENOUS
  Filled 2018-02-24: qty 5

## 2018-02-24 MED ORDER — DIPHENHYDRAMINE HCL 25 MG PO CAPS: 25.0000 mg | ORAL_CAPSULE | Freq: Four times a day (QID) | ORAL | Status: DC | PRN

## 2018-02-24 MED ORDER — PHENYLEPHRINE 40 MCG/ML (10ML) SYRINGE FOR IV PUSH (FOR BLOOD PRESSURE SUPPORT)
80.0000 ug | PREFILLED_SYRINGE | INTRAVENOUS | Status: DC | PRN
  Filled 2018-02-24: qty 10
  Filled 2018-02-24: qty 5

## 2018-02-24 MED ORDER — ACETAMINOPHEN 325 MG PO TABS: 650.0000 mg | ORAL_TABLET | ORAL | Status: DC | PRN

## 2018-02-24 MED ORDER — SIMETHICONE 80 MG PO CHEW: 80.0000 mg | CHEWABLE_TABLET | ORAL | Status: DC | PRN

## 2018-02-24 MED ORDER — LACTATED RINGERS IV SOLN
INTRAVENOUS | Status: DC
  Administered 2018-02-24 (×2): via INTRAVENOUS

## 2018-02-24 MED ORDER — EPHEDRINE 5 MG/ML INJ
10.0000 mg | INTRAVENOUS | Status: DC | PRN
  Filled 2018-02-24: qty 2

## 2018-02-24 MED ORDER — ZOLPIDEM TARTRATE 5 MG PO TABS: 5.0000 mg | ORAL_TABLET | Freq: Every evening | ORAL | Status: DC | PRN

## 2018-02-24 MED ORDER — WITCH HAZEL-GLYCERIN EX PADS: 1.0000 "application " | MEDICATED_PAD | CUTANEOUS | Status: DC | PRN

## 2018-02-24 MED ORDER — DIPHENHYDRAMINE HCL 50 MG/ML IJ SOLN: 12.5000 mg | INTRAMUSCULAR | Status: DC | PRN

## 2018-02-24 MED ORDER — ONDANSETRON HCL 4 MG PO TABS: 4.0000 mg | ORAL_TABLET | ORAL | Status: DC | PRN

## 2018-02-24 MED ORDER — ACETAMINOPHEN 325 MG PO TABS
650.0000 mg | ORAL_TABLET | ORAL | Status: DC | PRN
  Administered 2018-02-24 – 2018-02-25 (×3): 650 mg via ORAL
  Filled 2018-02-24 (×3): qty 2

## 2018-02-24 MED ORDER — SENNOSIDES-DOCUSATE SODIUM 8.6-50 MG PO TABS
2.0000 | ORAL_TABLET | ORAL | Status: DC
  Administered 2018-02-24: 2 via ORAL
  Filled 2018-02-24: qty 2

## 2018-02-24 MED ORDER — LIDOCAINE HCL (PF) 1 % IJ SOLN
30.0000 mL | INTRAMUSCULAR | Status: DC | PRN
  Filled 2018-02-24: qty 30

## 2018-02-24 MED ORDER — LACTATED RINGERS IV SOLN: 500.0000 mL | Freq: Once | INTRAVENOUS | Status: DC

## 2018-02-24 MED ORDER — BENZOCAINE-MENTHOL 20-0.5 % EX AERO: 1.0000 "application " | INHALATION_SPRAY | CUTANEOUS | Status: DC | PRN

## 2018-02-24 MED ORDER — PRENATAL MULTIVITAMIN CH
1.0000 | ORAL_TABLET | Freq: Every day | ORAL | Status: DC
  Administered 2018-02-25: 1 via ORAL
  Filled 2018-02-24: qty 1

## 2018-02-24 MED ORDER — FENTANYL CITRATE (PF) 100 MCG/2ML IJ SOLN
100.0000 ug | INTRAMUSCULAR | Status: DC | PRN
  Administered 2018-02-24: 100 ug via INTRAVENOUS
  Filled 2018-02-24: qty 2

## 2018-02-24 MED ORDER — TETANUS-DIPHTH-ACELL PERTUSSIS 5-2.5-18.5 LF-MCG/0.5 IM SUSP: 0.5000 mL | Freq: Once | INTRAMUSCULAR | Status: DC

## 2018-02-24 MED ORDER — LACTATED RINGERS IV SOLN: 500.0000 mL | INTRAVENOUS | Status: DC | PRN

## 2018-02-24 MED ORDER — OXYTOCIN 40 UNITS IN LACTATED RINGERS INFUSION - SIMPLE MED
2.5000 [IU]/h | INTRAVENOUS | Status: DC
  Administered 2018-02-24: 2.5 [IU]/h via INTRAVENOUS
  Filled 2018-02-24: qty 1000

## 2018-02-24 MED ORDER — SOD CITRATE-CITRIC ACID 500-334 MG/5ML PO SOLN: 30.0000 mL | ORAL | Status: DC | PRN

## 2018-02-24 MED ORDER — PENICILLIN G POT IN DEXTROSE 60000 UNIT/ML IV SOLN
3.0000 10*6.[IU] | INTRAVENOUS | Status: DC
  Administered 2018-02-24: 3 10*6.[IU] via INTRAVENOUS
  Filled 2018-02-24 (×4): qty 50

## 2018-02-24 MED ORDER — COCONUT OIL OIL: 1.0000 "application " | TOPICAL_OIL | Status: DC | PRN

## 2018-02-24 MED ORDER — LIDOCAINE HCL (PF) 1 % IJ SOLN
INTRAMUSCULAR | Status: DC | PRN
  Administered 2018-02-24: 5 mL via EPIDURAL
  Administered 2018-02-24: 6 mL via EPIDURAL

## 2018-02-24 MED ORDER — ONDANSETRON HCL 4 MG/2ML IJ SOLN: 4.0000 mg | Freq: Four times a day (QID) | INTRAMUSCULAR | Status: DC | PRN

## 2018-02-24 NOTE — MAU Note (Signed)
Contractions started at 0330.  Started leaking clear fluid around 5, still coming.  Has not been checked.  States " I never dilate, I always have to be induced".

## 2018-02-24 NOTE — Anesthesia Pain Management Evaluation Note (Signed)
  CRNA Pain Management Visit Note  Patient: Priscilla Sherman, 37 y.o., female  "Hello I am a member of the anesthesia team at [facility]. We have an anesthesia team available at all times to provide care throughout the hospital, including epidural management and anesthesia for C-section. I don't know your plan for the delivery whether it a natural birth, water birth, IV sedation, nitrous supplementation, doula or epidural, but we want to meet your pain goals."   1.Was your pain managed to your expectations on prior hospitalizations?   Yes   2.What is your expectation for pain management during this hospitalization?     Epidural  3.How can we help you reach that goal? epidural  Record the patient's initial score and the patient's pain goal.   Pain: 9/10 Pain Goal: 3/10 [facility] wants you to be able to say your pain was always managed very well.  Salome ArntSterling, Danyal Whitenack Marie 02/24/2018

## 2018-02-24 NOTE — H&P (Addendum)
OBSTETRIC ADMISSION HISTORY AND PHYSICAL  Priscilla Sherman is a 37 y.o. female [id number] with IUP at 7586w6d presenting for SOL and SROM 0500 with clear liquor. She reports +FMs. No LOF, VB, blurry vision, headaches, peripheral edema, or RUQ pain. She plans on Breast feeding. She requests IUD also has signed BTL papers for birth control.  Dating: By [place] CRL 13wk6days --->  Estimated Date of Delivery: 02/25/18  Sono:    @[redacted]w[redacted]d , CWD, normal anatomy, cephalic presentation, 258g, 51%ile, EFW 258g   Prenatal History/Complications: None  Past Medical History: Past Medical History:  Diagnosis Date  . Anxiety   . Chlamydia     Past Surgical History: Past Surgical History:  Procedure Laterality Date  . NO PAST SURGERIES      Obstetrical History: OB History    Gravida  5   Para  3   Term  3   Preterm      AB  1   Living  3     SAB      TAB  1   Ectopic      Multiple  0   Live Births  3           Social History: Social History   Socioeconomic History  . Marital status: Single    Spouse name: Not on file  . Number of children: Not on file  . Years of education: Not on file  . Highest education level: Not on file  Occupational History  . Not on file  Social Needs  . Financial resource strain: Not on file  . Food insecurity:    Worry: Not on file    Inability: Not on file  . Transportation needs:    Medical: Not on file    Non-medical: Not on file  Tobacco Use  . Smoking status: Current Some Day Smoker    Packs/day: 0.10    Years: 10.00    Pack years: 1.00    Types: Cigarettes  . Smokeless tobacco: Never Used  Substance and Sexual Activity  . Alcohol use: Not Currently    Comment: socially  . Drug use: Not Currently    Types: Marijuana    Comment: last used 9 montha ago  . Sexual activity: Yes    Partners: Male    Birth control/protection: None  Lifestyle  . Physical activity:    Days per week: Not on file    Minutes per session: Not on file  .  Stress: Not on file  Relationships  . Social connections:    Talks on phone: Not on file    Gets together: Not on file    Attends religious service: Not on file    Active member of club or organization: Not on file    Attends meetings of clubs or organizations: Not on file    Relationship status: Not on file  Other Topics Concern  . Not on file  Social History Narrative  . Not on file    Family History: Family History  Problem Relation Age of Onset  . Thyroid disease Mother        ?hypoactive  . Cancer Father     Allergies: No Known Allergies  Medications Prior to Admission  Medication Sig Dispense Refill Last Dose  . calcium-vitamin D (OSCAL WITH D) 250-125 MG-UNIT tablet Take 1 tablet by mouth daily.   02/23/2018 at Unknown time  . Prenatal Multivit-Min-Fe-FA (PRENATAL VITAMINS) 0.8 MG tablet Take 1 tablet by mouth daily. 30 tablet  12 02/23/2018 at Unknown time  . butalbital-acetaminophen-caffeine (FIORICET, ESGIC) 50-325-40 MG tablet Take 1 tablet by mouth every 6 (six) hours as needed for headache. (Patient not taking: Reported on 01/02/2018) 20 tablet 0 Not Taking  . docusate sodium (COLACE) 100 MG capsule Take 1 capsule (100 mg total) by mouth 2 (two) times daily. (Patient not taking: Reported on 01/30/2018) 60 capsule 0 Not Taking  . ferrous sulfate 325 (65 FE) MG tablet Take 325 mg by mouth daily with breakfast.   02/21/2018  . ondansetron (ZOFRAN ODT) 4 MG disintegrating tablet Take 1 tablet (4 mg total) by mouth every 8 (eight) hours as needed for nausea or vomiting. (Patient not taking: Reported on 12/19/2017) 10 tablet 0 Not Taking     Review of Systems   All systems reviewed and negative except as stated in HPI  Blood pressure 129/71, pulse 71, temperature 97.7 F (36.5 C), temperature source Oral, resp. rate 18, height 5\' 2"  (1.575 m), weight 61.2 kg (135 lb), last menstrual period 07/21/2017. General appearance: alert and cooperative Lungs: regular rate and  effort Heart: regular rate  Abdomen: soft, non-tender Extremities: Homans sign is negative, no sign of DVT Presentation: cephalic Fetal monitoringBaseline: 130s bpm good variability, scattered early decels, +Accels  Uterine activityFrequency: 1 Every 3 minutes Dilation: 2 Effacement (%): 100 Station: -2 Exam by:: Raliegh Ip RN   Prenatal labs: ABO, Rh: --/--/B POS (07/23 1610) Antibody: NEG (07/23 9604) Rubella: 3.66 (01/22 1330) RPR: Non Reactive (05/17 1018)  HBsAg: Negative (01/22 1330)  HIV: Non Reactive (05/17 1018)  GBS: Positive (06/28 1142)  2 hr GTT Negative  Prenatal Transfer Tool  Maternal Diabetes: No Genetic Screening: Normal Maternal Ultrasounds/Referrals: Normal Fetal Ultrasounds or other Referrals:  None Maternal Substance Abuse:  No Significant Maternal Medications:  None Significant Maternal Lab Results: Lab values include: Group B Strep positive  Results for orders placed or performed during the hospital encounter of 02/24/18 (from the past 24 hour(s))  Fern Test   Collection Time: 02/24/18  7:39 AM  Result Value Ref Range   POCT Fern Test Positive = ruptured amniotic membanes   CBC   Collection Time: 02/24/18  8:33 AM  Result Value Ref Range   WBC 11.5 (H) 4.0 - 10.5 K/uL   RBC 3.72 (L) 3.87 - 5.11 MIL/uL   Hemoglobin 11.8 (L) 12.0 - 15.0 g/dL   HCT 54.0 (L) 98.1 - 19.1 %   MCV 92.2 78.0 - 100.0 fL   MCH 31.7 26.0 - 34.0 pg   MCHC 34.4 30.0 - 36.0 g/dL   RDW 47.8 29.5 - 62.1 %   Platelets 348 150 - 400 K/uL  Type and screen [facility]   Collection Time: 02/24/18  8:33 AM  Result Value Ref Range   ABO/RH(D) B POS    Antibody Screen NEG    Sample Expiration      02/27/2018 Performed at [facility], [address], [place], [place] [address]     Patient Active Problem List   Diagnosis Date Noted  . Pregnancy 02/24/2018  . Positive GBS test 02/04/2018  . AMA (advanced maternal age) multigravida 35+, third  trimester 01/16/2018  . Encounter for supervision of normal pregnancy, unspecified, unspecified trimester 08/26/2017  . Smoking 08/26/2017    Assessment: Priscilla Sherman is a 37 y.o. [id number] at [redacted]w[redacted]d here for SOL with SROM GBS+ (PCN)  1. Labor: early latent  2. FWB: Cat 1 (early decels, with good variability and +accels ,  some areas of strip with poor connection) 3. Pain: Moderate 4. GBS: Positive (PCN)   Plan: Expectant monitoring  PCN GBS prophylaxis   NSVD anticipated  Maternal and fetal vitals monitoring.  Sandi Raveling, MD  02/24/2018, 9:55 AM   OB FELLOW HISTORY AND PHYSICAL ATTESTATION  I have seen and examined this patient; I agree with above documentation in the resident's note.    Frederik Pear, MD OB Fellow 02/24/2018, 2:46 PM

## 2018-02-24 NOTE — Anesthesia Preprocedure Evaluation (Signed)
Anesthesia Evaluation  Patient identified by MRN, date of birth, ID band Patient awake    Reviewed: Allergy & Precautions, H&P , NPO status , Patient's Chart, lab work & pertinent test results  Airway Mallampati: I  TM Distance: >3 FB Neck ROM: full    Dental no notable dental hx. (+) Teeth Intact   Pulmonary neg pulmonary ROS, Current Smoker,    Pulmonary exam normal breath sounds clear to auscultation       Cardiovascular negative cardio ROS Normal cardiovascular exam Rhythm:regular Rate:Normal     Neuro/Psych negative neurological ROS     GI/Hepatic negative GI ROS, Neg liver ROS,   Endo/Other  negative endocrine ROS  Renal/GU negative Renal ROS  negative genitourinary   Musculoskeletal negative musculoskeletal ROS (+)   Abdominal Normal abdominal exam  (+)   Peds  Hematology negative hematology ROS (+)   Anesthesia Other Findings   Reproductive/Obstetrics (+) Pregnancy                             Anesthesia Physical Anesthesia Plan  ASA: II  Anesthesia Plan: Epidural   Post-op Pain Management:    Induction:   PONV Risk Score and Plan:   Airway Management Planned:   Additional Equipment:   Intra-op Plan:   Post-operative Plan:   Informed Consent: I have reviewed the patients History and Physical, chart, labs and discussed the procedure including the risks, benefits and alternatives for the proposed anesthesia with the patient or authorized representative who has indicated his/her understanding and acceptance.     Plan Discussed with:   Anesthesia Plan Comments:         Anesthesia Quick Evaluation

## 2018-02-24 NOTE — Anesthesia Procedure Notes (Signed)
Epidural Patient location during procedure: OB Start time: 02/24/2018 10:08 AM End time: 02/24/2018 10:11 AM  Staffing Anesthesiologist: Leilani AbleHatchett, Temekia Caskey, MD Performed: anesthesiologist   Preanesthetic Checklist Completed: patient identified, site marked, surgical consent, pre-op evaluation, timeout performed, IV checked, risks and benefits discussed and monitors and equipment checked  Epidural Patient position: sitting Prep: site prepped and draped and DuraPrep Patient monitoring: continuous pulse ox and blood pressure Approach: midline Location: L3-L4 Injection technique: LOR air  Needle:  Needle type: Tuohy  Needle gauge: 17 G Needle length: 9 cm and 9 Needle insertion depth: 6 cm Catheter type: closed end flexible Catheter size: 19 Gauge Catheter at skin depth: 11 cm Test dose: negative and Other  Assessment Sensory level: T9 Events: blood not aspirated, injection not painful, no injection resistance, negative IV test and no paresthesia  Additional Notes Reason for block:procedure for pain

## 2018-02-24 NOTE — Lactation Note (Signed)
This note was copied from a baby's chart. Lactation Consultation Note  Patient Name: Priscilla Sherman YQMVH'QToday's Date: 02/24/2018 Reason for consult: Initial assessment;Term  7 hours old FT female who is still being exclusively BF by her mother, she's a P4 and experienced BF. She was able to BF her first and second child for 6 months, but did not BF her last one. She plans to supplement this baby with formula at some point, that was her feeding choice upon admission. [facility], she already knows how to hand express; colostrum noted when mom did hand expression teach back with LC. Mom doesn't have a pump at home, [facility] pump instructions, cleaning and storage was reviewed.   Baby was already nursing when entering the room, LC assisted with latch, mom had baby swaddled, she agreed to have blanket removed but did not remove t-shirt, unable to do STS at this point. LC took baby to mom's left breast in cross cradle position and she was able to latch after a few tries, she was a little sleepy though, had to pull baby's chin down to get her to open her mouth wide. A few swallows were heard, baby was able to feed for 17 minutes.  LC noted that mom already had two bottles of Gerber Gentle formula in the room, explained to mom about the size of baby stomach; and that babies only need small amounts of colostrum at this point, mom verbalized understanding and voice she only asked for formula "just in case". Praised mom for her efforts to provide breastmilk for her baby.  Encouraged mom to feed baby STS 8-12 times/24 hours or sooner if feeding cues are present. Mom will also try pumping or hand expressing and will spoon feed any amount of colostrum she may get, she's aware that her baby is at risk of jaundice due to the bruising. BF brochure, BF resources and feeding diary were reviewed, mom is aware of LC services and will call PRN.  Maternal  Data Formula Feeding for Exclusion: Yes Reason for exclusion: Mother's choice to formula and breast feed on admission Has patient been taught Hand Expression?: Yes Does the patient have breastfeeding experience prior to this delivery?: Yes  Feeding Feeding Type: Breast Fed Length of feed: 17 min  LATCH Score Latch: Repeated attempts needed to sustain latch, nipple held in mouth throughout feeding, stimulation needed to elicit sucking reflex.  Audible Swallowing: A few with stimulation  Type of Nipple: Everted at rest and after stimulation  Comfort (Breast/Nipple): Soft / non-tender  Hold (Positioning): Assistance needed to correctly position infant at breast and maintain latch.  LATCH Score: 7  Interventions Interventions: Breast feeding basics reviewed;Assisted with latch;Breast massage;Hand pump;Breast compression;Adjust position;Support pillows;Position options  Lactation Tools Discussed/Used Tools: Pump Breast pump type: Manual WIC Program: Yes Pump Review: Setup, frequency, and cleaning;Milk Storage Initiated by:: MPeck Date initiated:: 02/24/18   Consult Status Consult Status: Follow-up Date: 02/25/18 Follow-up type: In-patient    Yared Susan Venetia ConstableS Taysean Wager 02/24/2018, 9:20 PM

## 2018-02-25 LAB — BIRTH TISSUE RECOVERY COLLECTION (PLACENTA DONATION)

## 2018-02-25 NOTE — Progress Notes (Signed)
Patient ID: Priscilla Sherman, female   DOB: 1980/08/24, 37 y.o.   MRN: [id number] POSTPARTUM PROGRESS NOTE  Post Partum Day 1 Subjective:  Priscilla Sherman is a 37 y.o. [id number] 7444w6d s/p SVD at 1400 yesterday.  No acute events overnight.  Pt denies problems with ambulating, voiding or po intake.  She denies nausea or vomiting.  She endorses some occasional abdominal cramping while breastfeeding. Pain is well controlled. Lochia Minimal.   Objective: Blood pressure 120/77, pulse 62, temperature 97.6 F (36.4 C), temperature source Oral, resp. rate 20, height 5\' 2"  (1.575 m), weight 61.2 kg (135 lb), last menstrual period 07/21/2017, SpO2 100 %, unknown if currently breastfeeding.  Physical Exam:  General: alert, cooperative and no distress Lochia:normal flow Chest: no respiratory distress Heart:regular rate, distal pulses intact Abdomen: soft, nontender,  Uterine Fundus: firm, appropriately tender DVT Evaluation: No calf swelling or tenderness Extremities: no edema  Recent Labs    02/24/18 0833  HGB 11.8*  HCT 34.3*   Vitals:   02/25/18 0235 02/25/18 0548  BP: 112/61 120/77  Pulse: (!) 58 62  Resp: 16 20  Temp: 97.8 F (36.6 C) 97.6 F (36.4 C)  SpO2:      Assessment/Plan:  ASSESSMENT: Priscilla Sherman is a 37 y.o. [id number] 1744w6d s/p SVD at 1400 yesterday  Feeding: Both bottle and breast Contraception: IUD Patient interested in going home later today   LOS: 1 day   Candie MileLindsey R Shyana Kulakowski 02/25/2018, 7:30 AM

## 2018-02-25 NOTE — Lactation Note (Signed)
This note was copied from a baby's chart. Lactation Consultation Note  Patient Name: Priscilla Sherman FAOZH'YToday's Date: 02/25/2018 Reason for consult: Follow-up assessment Mom states baby is breastfeeding well.  She recently tried to give baby formula but baby refused.  Discussed milk coming to volume and prevention and treatment of engorgement.  Lactation outpatient services and support reviewed and encouraged.  Maternal Data    Feeding Feeding Type: Breast Fed Length of feed: 15 min  LATCH Score                   Interventions    Lactation Tools Discussed/Used     Consult Status Consult Status: Complete Follow-up type: Call as needed    Huston FoleyMOULDEN, Alegandra Sommers S 02/25/2018, 12:35 PM

## 2018-02-25 NOTE — Anesthesia Postprocedure Evaluation (Signed)
Anesthesia Post Note  Patient: Frederich Charin J Schuchard  Procedure(s) Performed: AN AD HOC LABOR EPIDURAL     Patient location during evaluation: Mother Baby Anesthesia Type: Epidural Level of consciousness: awake and alert Pain management: pain level controlled Vital Signs Assessment: post-procedure vital signs reviewed and stable Respiratory status: spontaneous breathing, nonlabored ventilation and respiratory function stable Cardiovascular status: stable Postop Assessment: no headache, no backache, epidural receding, no apparent nausea or vomiting, able to ambulate, patient able to bend at knees and adequate PO intake Anesthetic complications: no    Last Vitals:  Vitals:   02/25/18 0235 02/25/18 0548  BP: 112/61 120/77  Pulse: (!) 58 62  Resp: 16 20  Temp: 36.6 C 36.4 C  SpO2:      Last Pain:  Vitals:   02/25/18 0725  TempSrc:   PainSc: 0-No pain   Pain Goal: Patients Stated Pain Goal: 2 (02/25/18 0235)               Laban EmperorMalinova,Mavis Fichera Hristova

## 2018-02-25 NOTE — Discharge Summary (Signed)
OB Discharge Summary     Patient Name: Priscilla Sherman DOB: 1981/07/24 MRN: [id number]  Date of admission: 02/24/2018 Delivering MD: Frederik Pear   Date of discharge: 02/25/2018  Admitting diagnosis: 40WKS,LABOR Intrauterine pregnancy: [redacted]w[redacted]d     Secondary diagnosis:  Active Problems:   Pregnancy   SVD (spontaneous vaginal delivery)  Additional problems: Hx of Anxiety      Discharge diagnosis: Term Pregnancy Delivered                                                                                                Post partum procedures:none  Augmentation: none  Complications: None  Hospital course:  Onset of Labor With Vaginal Delivery     37 y.o. yo [id number] at [redacted]w[redacted]d was admitted in Latent Labor on 02/24/2018. Patient had an uncomplicated labor course as follows:  Membrane Rupture Time/Date: 5:00 AM ,02/24/2018   Intrapartum Procedures: Episiotomy: None [1]                                         Lacerations:  None [1]  Patient had a delivery of a Viable infant. 02/24/2018  Information for the patient's newborn:  Blaize, Nipper Girl Darrion [[id number]]  Delivery Method: Vaginal, Spontaneous(Filed from Delivery Summary)    Pateint had an uncomplicated postpartum course.  She is ambulating, tolerating a regular diet, passing flatus, and urinating well. Patient is discharged home in stable condition on 02/25/18.   Physical exam  Vitals:   02/24/18 2134 02/25/18 0235 02/25/18 0548 02/25/18 1420  BP: 116/73 112/61 120/77 104/69  Pulse: 62 (!) 58 62 62  Resp:  16 20 18   Temp: 97.9 F (36.6 C) 97.8 F (36.6 C) 97.6 F (36.4 C) 98.4 F (36.9 C)  TempSrc: Oral Oral Oral Oral  SpO2: 100%     Weight:      Height:       General: alert, cooperative and no distress Lochia: appropriate Uterine Fundus: firm Incision: N/A DVT Evaluation: No evidence of DVT seen on physical exam. No cords or calf tenderness. No significant calf/ankle edema. Labs: Lab Results  Component Value Date   WBC  11.5 (H) 02/24/2018   HGB 11.8 (L) 02/24/2018   HCT 34.3 (L) 02/24/2018   MCV 92.2 02/24/2018   PLT 348 02/24/2018   CMP Latest Ref Rng & Units 12/21/2017  Glucose 65 - 99 mg/dL 96  BUN 6 - 20 mg/dL 5(L)  Creatinine 9.62 - 1.00 mg/dL 9.52  Sodium 841 - 324 mmol/L 132(L)  Potassium 3.5 - 5.1 mmol/L 4.7  Chloride 101 - 111 mmol/L 106  CO2 22 - 32 mmol/L 17(L)  Calcium 8.9 - 10.3 mg/dL 4.0(N)  Total Protein 6.5 - 8.1 g/dL 6.1(L)  Total Bilirubin 0.3 - 1.2 mg/dL 1.0  Alkaline Phos 38 - 126 U/L 77  AST 15 - 41 U/L 33  ALT 14 - 54 U/L 21    Discharge instruction: per After Visit Summary and "Baby and Me Booklet".  After visit meds:  Allergies as of 02/25/2018   No Known Allergies     Medication List    TAKE these medications   butalbital-acetaminophen-caffeine 50-325-40 MG tablet Commonly known as:  FIORICET, ESGIC Take 1 tablet by mouth every 6 (six) hours as needed for headache.   calcium-vitamin D 250-125 MG-UNIT tablet Commonly known as:  OSCAL WITH D Take 1 tablet by mouth daily.   docusate sodium 100 MG capsule Commonly known as:  COLACE Take 1 capsule (100 mg total) by mouth 2 (two) times daily.   ferrous sulfate 325 (65 FE) MG tablet Take 325 mg by mouth daily with breakfast.   ondansetron 4 MG disintegrating tablet Commonly known as:  ZOFRAN ODT Take 1 tablet (4 mg total) by mouth every 8 (eight) hours as needed for nausea or vomiting.   Prenatal Vitamins 0.8 MG tablet Take 1 tablet by mouth daily.       Diet: routine diet  Activity: Advance as tolerated. Pelvic rest for 6 weeks.   Outpatient follow up:4 weeks Follow up Appt: Future Appointments  Date Time Provider Department Center  03/24/2018  2:30 PM Currie ParisBurleson, Terri L, NP [facility] None   Follow up Visit:No follow-ups on file.  Postpartum contraception: IUD Mirena  Newborn Data: Live born female  Birth Weight: 6 lb 14.9 oz (3145 g) APGAR: 8, 9  Newborn Delivery   Birth date/time:   02/24/2018 14:16:00 Delivery type:  Vaginal, Spontaneous     Baby Feeding: Bottle and Breast Disposition:rooming in   02/25/2018 Sharyon CableVeronica C Lummie Montijo, CNM

## 2018-02-26 ENCOUNTER — Ambulatory Visit: Payer: Self-pay

## 2018-02-26 NOTE — Lactation Note (Signed)
This note was copied from a baby's chart. Lactation Consultation Note  Patient Name: Priscilla Sherman OZHYQ'MToday's Date: 02/26/2018   Visited with P4 Mom of term infant at 3243 hrs old. Baby at 6% weight loss.  Mom denies any difficulty with latching and breastfeeding.  Encouraged STS and cue based feeding.  Goal of 8-12 times per 24 hrs.    Engorgement prevention and treatment discussed.   Mom aware of OP lactation support available to her, and encouraged to call prn.   Judee ClaraSmith, Rilya Longo E 02/26/2018, 9:35 AM

## 2018-02-27 ENCOUNTER — Encounter: Payer: Medicaid Other | Admitting: Obstetrics

## 2018-03-04 ENCOUNTER — Inpatient Hospital Stay (HOSPITAL_COMMUNITY): Admission: RE | Admit: 2018-03-04 | Payer: Medicaid Other | Source: Ambulatory Visit

## 2018-03-24 ENCOUNTER — Other Ambulatory Visit (HOSPITAL_COMMUNITY)
Admission: RE | Admit: 2018-03-24 | Discharge: 2018-03-24 | Disposition: A | Discharge status: Home / Self Care | Payer: Medicaid Other | Source: Ambulatory Visit | Attending: Nurse Practitioner | Admitting: Nurse Practitioner

## 2018-03-24 ENCOUNTER — Ambulatory Visit (INDEPENDENT_AMBULATORY_CARE_PROVIDER_SITE_OTHER): Payer: Medicaid Other | Admitting: Nurse Practitioner

## 2018-03-24 ENCOUNTER — Encounter: Payer: Self-pay | Admitting: Nurse Practitioner

## 2018-03-24 DIAGNOSIS — Z1389 Encounter for screening for other disorder: Secondary | ICD-10-CM

## 2018-03-24 DIAGNOSIS — Z3202 Encounter for pregnancy test, result negative: Secondary | ICD-10-CM

## 2018-03-24 DIAGNOSIS — Z30430 Encounter for insertion of intrauterine contraceptive device: Secondary | ICD-10-CM

## 2018-03-24 DIAGNOSIS — F17200 Nicotine dependence, unspecified, uncomplicated: Secondary | ICD-10-CM

## 2018-03-24 LAB — POCT URINE PREGNANCY: PREG TEST UR: NEGATIVE

## 2018-03-24 MED ORDER — LEVONORGESTREL 19.5 MCG/DAY IU IUD
INTRAUTERINE_SYSTEM | Freq: Once | INTRAUTERINE | Status: AC
  Administered 2018-03-24: 16:00:00 via INTRAUTERINE

## 2018-03-24 NOTE — Patient Instructions (Signed)

## 2018-03-24 NOTE — Progress Notes (Signed)
Post Partum Exam  Priscilla Sherman is a 37 y.o. [id number] female who presents for a postpartum visit. She is 4 weeks postpartum following a spontaneous vaginal delivery. I have fully reviewed the prenatal and intrapartum course. The delivery was at 66101w6d gestational weeks.  Anesthesia: epidural. Postpartum course has been Unremarkable. Baby's course has been Unremarkable. Baby is feeding by bottle Rush Barer- Gerber. . Bleeding no bleeding. Bowel function is normal. Bladder function is normal. Patient is not sexually active. Contraception method is none. She wants an IUD. Last IC 7 mos ago per pt. Postpartum depression screening:neg  The following portions of the patient's history were reviewed and updated as appropriate: current medications, past family history, past medical history, past social history, past surgical history and problem list. Last pap smear done 08-26-17 and was Normal  Review of Systems A comprehensive review of systems was negative.    Objective:    General:  alert, cooperative and no distress   Breasts:  deferred  Lungs: clear to auscultation bilaterally  Heart:  regular rate and rhythm, S1, S2 normal, no murmur, click, rub or gallop  Abdomen: soft, non-tender; bowel sounds normal; no masses,  no organomegaly   Vulva:  normal  Vagina: normal vagina  Cervix:  no CMT  Corpus: normal size, contour, position, consistency, mobility, non-tender  Adnexa:  no mass, fullness, tenderness  Rectal Exam: Not performed.        IUD Insertion Procedure Note Patient identified, informed consent performed, consent signed.   Discussed risks of irregular bleeding, cramping, infection, malpositioning or misplacement of the IUD outside the uterus which may require further procedure such as laparoscopy. Time out was performed.  Urine pregnancy test negative.  Speculum placed in the vagina.  Cervix visualized.  GC, Chlam, and yeast swab done.  Cleaned with Betadine x 3. Some clumpy vaginal discharge seen  suspicious for yeast.   Hurricane spray used.  Grasped anteriorly with a single tooth tenaculum.  Uterus sounded to 9.5 cm.  Liletta IUD placed per manufacturer's recommendations.  Strings trimmed to 3 cm. Tenaculum was removed, good hemostasis noted.  Patient tolerated procedure well.   Patient was given post-procedure instructions.  She was advised to have backup contraception for one week.  Patient was also asked to check IUD strings periodically and follow up in 4 weeks for IUD check.    Assessment:    Normal postpartum exam. Pap smear not done at today's visit.   Plan:   1. Contraception: IUD 2. Return for string check in 4 weeks 3. Follow up in: 4 weeks or as needed.  4.  Advised to stop all smoking.  Priscilla BernheimERRI Kameisha Malicki, RN, MSN, NP-BC Nurse Practitioner, [facility], [facility] 03/24/2018 3:58 PM

## 2018-03-25 LAB — CERVICOVAGINAL ANCILLARY ONLY
Candida vaginitis: NEGATIVE
Chlamydia: NEGATIVE
Neisseria Gonorrhea: NEGATIVE

## 2018-04-21 ENCOUNTER — Encounter: Payer: Self-pay | Admitting: Obstetrics and Gynecology

## 2018-04-21 ENCOUNTER — Ambulatory Visit: Payer: Medicaid Other | Admitting: Obstetrics and Gynecology

## 2018-04-21 DIAGNOSIS — Z30431 Encounter for routine checking of intrauterine contraceptive device: Secondary | ICD-10-CM | POA: Insufficient documentation

## 2018-04-21 NOTE — Patient Instructions (Signed)

## 2018-04-21 NOTE — Progress Notes (Signed)
Liletta IUD placed 03/24/18. Pt reports no issues, no bleeding. Pt is no longer breastfeeding.

## 2018-04-21 NOTE — Progress Notes (Signed)
Patient ID: Priscilla Sherman, female   DOB: 1981/07/18, 37 y.o.   MRN: [id number] Here for IUD check Liletta placed 03/24/18. Doing well no complaints Has been sexual active since insertion without problems. Pap 1/19 nl  PE AF VSS Lungs clear Heart RRR GU IUD string noted  A/P IUD check F/U PRN or in 1 yr

## 2019-07-24 ENCOUNTER — Encounter (HOSPITAL_COMMUNITY): Payer: Self-pay

## 2019-07-24 ENCOUNTER — Other Ambulatory Visit: Payer: Self-pay

## 2019-07-24 ENCOUNTER — Ambulatory Visit (HOSPITAL_COMMUNITY)
Admission: EM | Admit: 2019-07-24 | Discharge: 2019-07-24 | Disposition: A | Discharge status: Home / Self Care | Payer: Medicaid Other | Attending: Family Medicine | Admitting: Family Medicine

## 2019-07-24 DIAGNOSIS — G500 Trigeminal neuralgia: Secondary | ICD-10-CM

## 2019-07-24 MED ORDER — GABAPENTIN 300 MG PO CAPS: 300.0000 mg | ORAL_CAPSULE | Freq: Three times a day (TID) | ORAL | 1 refills | Status: DC

## 2019-07-24 MED ORDER — PREDNISONE 50 MG PO TABS: ORAL_TABLET | ORAL | 1 refills | Status: DC

## 2019-07-24 NOTE — ED Triage Notes (Signed)
Pt present right side jaw and neck pain. Symptoms started four days ago.

## 2019-07-24 NOTE — ED Provider Notes (Signed)
[place]    CSN: [id number] Arrival date & time: 07/24/19  1239      History   Chief Complaint Chief Complaint  Patient presents with  . Jaw Pain  . Neck Pain    HPI AMARA JUSTEN is a 38 y.o. female.   This is the initial [facility] urgent care encounter for this 38 year old woman.  Pt present right side jaw and neck pain. Symptoms started four days ago.  She describes initially a dull right-sided headache which gradually worsened to sharp pains shooting on the side of her right face, numbness in her gums on the right, and shooting pains and tightness in the right neck muscles.  Patient had no recent trauma or fever.  She has had no gum disease that she knows of.     Past Medical History:  Diagnosis Date  . Anxiety   . Chlamydia     Patient Active Problem List   Diagnosis Date Noted  . IUD check up 04/21/2018  . Smoking 08/26/2017    Past Surgical History:  Procedure Laterality Date  . NO PAST SURGERIES      OB History    Gravida  5   Para  4   Term  4   Preterm      AB  1   Living  4     SAB      TAB  1   Ectopic      Multiple  0   Live Births  4            Home Medications    Prior to Admission medications   Medication Sig Start Date End Date Taking? Authorizing Provider  calcium-vitamin D (OSCAL WITH D) 250-125 MG-UNIT tablet Take 1 tablet by mouth daily.    [provider]  gabapentin (NEURONTIN) 300 MG capsule Take 1 capsule (300 mg total) by mouth 3 (three) times daily. 07/24/19   Robyn Haber, MD  predniSONE (DELTASONE) 50 MG tablet 1 daily with food 07/24/19   Robyn Haber, MD    Family History Family History  Problem Relation Age of Onset  . Thyroid disease Mother        ?hypoactive  . Cancer Father     Social History Social History   Tobacco Use  . Smoking status: Current Some Day Smoker    Packs/day: 0.10    Years: 10.00    Pack years: 1.00    Types: Cigarettes  . Smokeless  tobacco: Never Used  Substance Use Topics  . Alcohol use: Not Currently    Comment: socially  . Drug use: Not Currently    Types: Marijuana    Comment: last used 9 montha ago     Allergies   Patient has no known allergies.   Review of Systems Review of Systems  Constitutional: Negative.   HENT: Negative for dental problem.   Musculoskeletal: Positive for neck pain.  All other systems reviewed and are negative.    Physical Exam Triage Vital Signs ED Triage Vitals  Enc Vitals Group     BP 07/24/19 1406 (!) 150/85     Pulse Rate 07/24/19 1406 62     Resp --      Temp 07/24/19 1406 98.7 F (37.1 C)     Temp Source 07/24/19 1406 Oral     SpO2 07/24/19 1406 99 %     Weight --      Height --      Head  Circumference --      Peak Flow --      Pain Score 07/24/19 1407 10     Pain Loc --      Pain Edu? --      Excl. in GC? --    No data found.  Updated Vital Signs BP (!) 150/85 (BP Location: Left Arm)   Pulse 62   Temp 98.7 F (37.1 C) (Oral)   SpO2 99%    Physical Exam Vitals and nursing note reviewed.  Constitutional:      General: She is not in acute distress.    Appearance: Normal appearance. She is normal weight. She is not ill-appearing or toxic-appearing.  HENT:     Head: Normocephalic and atraumatic.     Right Ear: Tympanic membrane, ear canal and external ear normal.     Nose: Nose normal.     Mouth/Throat:     Mouth: Mucous membranes are moist.     Pharynx: Oropharynx is clear.  Eyes:     Conjunctiva/sclera: Conjunctivae normal.     Pupils: Pupils are equal, round, and reactive to light.  Cardiovascular:     Rate and Rhythm: Normal rate.  Pulmonary:     Effort: Pulmonary effort is normal.  Musculoskeletal:        General: Normal range of motion.     Cervical back: Normal range of motion and neck supple.  Skin:    General: Skin is warm and dry.  Neurological:     General: No focal deficit present.     Mental Status: She is alert and  oriented to person, place, and time.  Psychiatric:        Mood and Affect: Mood normal.        Thought Content: Thought content normal.      UC Treatments / Results  Labs (all labs ordered are listed, but only abnormal results are displayed) Labs Reviewed - No data to display  EKG   Radiology No results found.  Procedures Procedures (including critical care time)  Medications Ordered in UC Medications - No data to display  Initial Impression / Assessment and Plan / UC Course  I have reviewed the triage vital signs and the nursing notes.  Pertinent labs & imaging results that were available during my care of the patient were reviewed by me and considered in my medical decision making (see chart for details).    Final Clinical Impressions(s) / UC Diagnoses   Final diagnoses:  Trigeminal neuralgia   Discharge Instructions   None    ED Prescriptions    Medication Sig Dispense Auth. Provider   gabapentin (NEURONTIN) 300 MG capsule Take 1 capsule (300 mg total) by mouth 3 (three) times daily. 60 capsule Elvina Sidle, MD   predniSONE (DELTASONE) 50 MG tablet 1 daily with food 5 tablet Elvina Sidle, MD     I have reviewed the PDMP during this encounter.   Elvina Sidle, MD 07/24/19 1424

## 2019-08-22 ENCOUNTER — Other Ambulatory Visit: Payer: Self-pay

## 2019-08-22 ENCOUNTER — Encounter (HOSPITAL_COMMUNITY): Payer: Self-pay | Admitting: *Deleted

## 2019-08-22 ENCOUNTER — Emergency Department (HOSPITAL_COMMUNITY)
Admission: EM | Admit: 2019-08-22 | Discharge: 2019-08-22 | Disposition: A | Discharge status: Home / Self Care | Payer: Self-pay | Attending: Emergency Medicine | Admitting: Emergency Medicine

## 2019-08-22 ENCOUNTER — Emergency Department (HOSPITAL_COMMUNITY): Payer: Self-pay

## 2019-08-22 DIAGNOSIS — Y939 Activity, unspecified: Secondary | ICD-10-CM | POA: Insufficient documentation

## 2019-08-22 DIAGNOSIS — Y92039 Unspecified place in apartment as the place of occurrence of the external cause: Secondary | ICD-10-CM | POA: Insufficient documentation

## 2019-08-22 DIAGNOSIS — S0231XA Fracture of orbital floor, right side, initial encounter for closed fracture: Secondary | ICD-10-CM | POA: Insufficient documentation

## 2019-08-22 DIAGNOSIS — Z720 Tobacco use: Secondary | ICD-10-CM | POA: Insufficient documentation

## 2019-08-22 DIAGNOSIS — Z79899 Other long term (current) drug therapy: Secondary | ICD-10-CM | POA: Insufficient documentation

## 2019-08-22 DIAGNOSIS — R519 Headache, unspecified: Secondary | ICD-10-CM | POA: Insufficient documentation

## 2019-08-22 DIAGNOSIS — Y999 Unspecified external cause status: Secondary | ICD-10-CM | POA: Insufficient documentation

## 2019-08-22 HISTORY — DX: Fracture of orbital floor, right side, initial encounter for closed fracture: S02.31XA

## 2019-08-22 MED ORDER — HYDROCODONE-ACETAMINOPHEN 5-325 MG PO TABS: 1.0000 | ORAL_TABLET | Freq: Four times a day (QID) | ORAL | 0 refills | Status: DC | PRN

## 2019-08-22 MED ORDER — ACETAMINOPHEN 500 MG PO TABS
1000.0000 mg | ORAL_TABLET | Freq: Once | ORAL | Status: AC
  Administered 2019-08-22: 1000 mg via ORAL
  Filled 2019-08-22: qty 2

## 2019-08-22 MED ORDER — CEPHALEXIN 500 MG PO CAPS: 500.0000 mg | ORAL_CAPSULE | Freq: Four times a day (QID) | ORAL | 0 refills | Status: AC

## 2019-08-22 NOTE — ED Notes (Signed)
Discharge instructions reviewed with pt. Pt verbalized understanding.   
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
## 2019-08-22 NOTE — Discharge Instructions (Signed)
You have a fracture of your right orbital floor, this can let air and from your sinuses so it is very important that you take antibiotics as directed to prevent infection.  Do not blow your nose.  Take ibuprofen or naproxen as needed for pain, Norco for breakthrough pain.  You will need to follow-up with Dr. Leta Baptist to ensure that this fracture is healing appropriately.
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
## 2019-10-06 ENCOUNTER — Other Ambulatory Visit: Payer: Self-pay

## 2019-10-06 ENCOUNTER — Emergency Department (HOSPITAL_COMMUNITY): Payer: Medicaid Other

## 2019-10-06 ENCOUNTER — Emergency Department (HOSPITAL_COMMUNITY)
Admission: EM | Admit: 2019-10-06 | Discharge: 2019-10-06 | Disposition: A | Discharge status: Home / Self Care | Payer: Medicaid Other | Attending: Emergency Medicine | Admitting: Emergency Medicine

## 2019-10-06 ENCOUNTER — Encounter (HOSPITAL_COMMUNITY): Payer: Self-pay

## 2019-10-06 DIAGNOSIS — R519 Headache, unspecified: Secondary | ICD-10-CM | POA: Diagnosis present

## 2019-10-06 DIAGNOSIS — S0231XA Fracture of orbital floor, right side, initial encounter for closed fracture: Secondary | ICD-10-CM

## 2019-10-06 DIAGNOSIS — F17210 Nicotine dependence, cigarettes, uncomplicated: Secondary | ICD-10-CM | POA: Insufficient documentation

## 2019-10-06 DIAGNOSIS — G8911 Acute pain due to trauma: Secondary | ICD-10-CM | POA: Insufficient documentation

## 2019-10-06 DIAGNOSIS — S0231XD Fracture of orbital floor, right side, subsequent encounter for fracture with routine healing: Secondary | ICD-10-CM | POA: Insufficient documentation

## 2019-10-06 MED ORDER — HYDROCODONE-ACETAMINOPHEN 5-325 MG PO TABS: 1.0000 | ORAL_TABLET | Freq: Four times a day (QID) | ORAL | 0 refills | Status: DC | PRN

## 2019-10-06 MED ORDER — LIDOCAINE 5 % EX PTCH: 1.0000 | MEDICATED_PATCH | CUTANEOUS | 0 refills | Status: DC

## 2019-10-06 MED ORDER — PREDNISONE 10 MG (21) PO TBPK: ORAL_TABLET | ORAL | 0 refills | Status: DC

## 2019-10-06 MED ORDER — KETOROLAC TROMETHAMINE 60 MG/2ML IM SOLN
30.0000 mg | Freq: Once | INTRAMUSCULAR | Status: AC
  Administered 2019-10-06: 20:00:00 30 mg via INTRAMUSCULAR
  Filled 2019-10-06: qty 2

## 2019-10-06 MED ORDER — HYDROCODONE-ACETAMINOPHEN 5-325 MG PO TABS
2.0000 | ORAL_TABLET | Freq: Once | ORAL | Status: AC
  Administered 2019-10-06: 2 via ORAL
  Filled 2019-10-06: qty 2

## 2019-10-06 MED ORDER — METHOCARBAMOL 500 MG PO TABS: 500.0000 mg | ORAL_TABLET | Freq: Two times a day (BID) | ORAL | 0 refills | Status: DC
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
## 2019-10-06 NOTE — ED Notes (Signed)
Pt transported to XR.  

## 2019-10-06 NOTE — Discharge Instructions (Signed)
  Antiinflammatory medications: Take 600 mg of ibuprofen every 6 hours or 440 mg (over the counter dose) to 500 mg (prescription dose) of naproxen every 12 hours for the next 3 days. After this time, these medications may be used as needed for pain. Take these medications with food to avoid upset stomach. Choose only one of these medications, do not take them together. Acetaminophen (generic for Tylenol): Should you continue to have additional pain while taking the ibuprofen or naproxen, you may add in acetaminophen as needed. Your daily total maximum amount of acetaminophen from all sources should be limited to 4000mg /day for persons without liver problems, or 2000mg /day for those with liver problems. Vicodin: May take Vicodin (hydrocodone-acetaminophen) as needed for severe pain.   Do not drive or perform other dangerous activities while taking this medication as it can cause drowsiness as well as changes in reaction time and judgement.   Please note that each pill of Vicodin contains 325 mg of acetaminophen (generic for Tylenol) and the above dosage limits apply. Methocarbamol: Methocarbamol (generic for Robaxin) is a muscle relaxer and can help relieve stiff muscles or muscle spasms.  Do not drive or perform other dangerous activities while taking this medication as it can cause drowsiness as well as changes in reaction time and judgement. Lidocaine patches: These are available via either prescription or over-the-counter. The over-the-counter option may be more economical one and are likely just as effective. There are multiple over-the-counter brands, such as Salonpas.  Follow-up: Follow-up with the maxillofacial specialist.  Call to make an appointment.  Return: Return for facial swelling, passing out, fever with your pain, or any other major concern.
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
## 2020-08-31 ENCOUNTER — Other Ambulatory Visit: Payer: Self-pay

## 2020-08-31 ENCOUNTER — Encounter (HOSPITAL_COMMUNITY): Payer: Self-pay

## 2020-08-31 ENCOUNTER — Emergency Department (HOSPITAL_COMMUNITY)
Admission: EM | Admit: 2020-08-31 | Discharge: 2020-08-31 | Disposition: A | Discharge status: Home / Self Care | Payer: Medicaid Other | Attending: Emergency Medicine | Admitting: Emergency Medicine

## 2020-08-31 DIAGNOSIS — R220 Localized swelling, mass and lump, head: Secondary | ICD-10-CM | POA: Insufficient documentation

## 2020-08-31 DIAGNOSIS — F17210 Nicotine dependence, cigarettes, uncomplicated: Secondary | ICD-10-CM | POA: Diagnosis not present

## 2020-08-31 MED ORDER — PENICILLIN V POTASSIUM 500 MG PO TABS: 500.0000 mg | ORAL_TABLET | Freq: Four times a day (QID) | ORAL | 0 refills | Status: AC
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
## 2020-08-31 NOTE — Discharge Instructions (Addendum)
Take penicillin as prescribed and complete the full course. Follow-up with dentist as discussed, referral given as well as resource guide. Continue to brush teeth using a soft toothbrush.  Also recommend rinse with Listerine after every meal.
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
## 2021-02-20 ENCOUNTER — Other Ambulatory Visit: Payer: Self-pay

## 2021-02-20 ENCOUNTER — Encounter (HOSPITAL_COMMUNITY): Payer: Self-pay

## 2021-02-20 ENCOUNTER — Ambulatory Visit (HOSPITAL_COMMUNITY)
Admission: EM | Admit: 2021-02-20 | Discharge: 2021-02-20 | Disposition: A | Discharge status: Home / Self Care | Payer: Medicaid Other | Attending: Emergency Medicine | Admitting: Emergency Medicine

## 2021-02-20 DIAGNOSIS — M5431 Sciatica, right side: Secondary | ICD-10-CM | POA: Diagnosis not present

## 2021-02-20 MED ORDER — PREDNISONE 20 MG PO TABS: 40.0000 mg | ORAL_TABLET | Freq: Every day | ORAL | 0 refills | Status: AC

## 2021-02-20 NOTE — ED Triage Notes (Signed)
Pt c/o lower right back pain (pinch feeling) and tingling to right toe.   Started: 3 days ago  Interventions: gabapentin- somewhat helpful

## 2021-02-20 NOTE — ED Provider Notes (Signed)
[facility]    CSN: [id number] Arrival date & time: 02/20/21  1050      History   Chief Complaint Chief Complaint  Patient presents with   Back Pain    HPI Priscilla Sherman is a 40 y.o. female.   Patient here for evaluation of right lower back pain that started 3 days ago.  Reports pain radiates down leg and has some tingling to right great toe.  Reports taking gabapentin with minimal relief.  Reports pain is constant and sharp, worse with movement.  Denies similar pain in the past.  Denies any trauma, injury, or other precipitating event.  Denies any specific alleviating or aggravating factors.  Denies any fevers, chest pain, shortness of breath, N/V/D, numbness, tingling, weakness, abdominal pain, or headaches.     The history is provided by the patient.  Back Pain Associated symptoms: no dysuria    Past Medical History:  Diagnosis Date   Anxiety    Chlamydia    Fracture of orbital floor, right side, initial encounter for closed fracture (HCC) 08/22/2019    Patient Active Problem List   Diagnosis Date Noted   IUD check up 04/21/2018   Smoking 08/26/2017    Past Surgical History:  Procedure Laterality Date   NO PAST SURGERIES      OB History     Gravida  5   Para  4   Term  4   Preterm      AB  1   Living  4      SAB      IAB  1   Ectopic      Multiple  0   Live Births  4            Home Medications    Prior to Admission medications   Medication Sig Start Date End Date Taking? Authorizing Provider  predniSONE (DELTASONE) 20 MG tablet Take 2 tablets (40 mg total) by mouth daily for 5 days. 02/20/21 02/25/21 Yes Ivette Loyal, NP  gabapentin (NEURONTIN) 300 MG capsule Take 1 capsule (300 mg total) by mouth 3 (three) times daily. 07/24/19   Elvina Sidle, MD  HYDROcodone-acetaminophen (NORCO/VICODIN) 5-325 MG tablet Take 1-2 tablets by mouth every 6 (six) hours as needed for severe pain. 10/06/19   Joy, Shawn C, PA-C  lidocaine  (LIDODERM) 5 % Place 1 patch onto the skin daily. Remove & Discard patch within 12 hours or as directed by MD 10/06/19   Joy, Shawn C, PA-C  methocarbamol (ROBAXIN) 500 MG tablet Take 1 tablet (500 mg total) by mouth 2 (two) times daily. 10/06/19   Joy, Hillard Danker, PA-C    Family History Family History  Problem Relation Age of Onset   Thyroid disease Mother        ?hypoactive   Cancer Father     Social History Social History   Tobacco Use   Smoking status: Some Days    Packs/day: 0.10    Years: 10.00    Pack years: 1.00    Types: Cigarettes   Smokeless tobacco: Never  Vaping Use   Vaping Use: Never used  Substance Use Topics   Alcohol use: Yes    Comment: socially   Drug use: Not Currently    Types: Marijuana    Comment: last used 9 montha ago     Allergies   Patient has no known allergies.   Review of Systems Review of Systems  Genitourinary:  Negative for dysuria,  flank pain and urgency.  Musculoskeletal:  Positive for back pain.  All other systems reviewed and are negative.   Physical Exam Triage Vital Signs ED Triage Vitals  Enc Vitals Group     BP 02/20/21 1152 136/79     Pulse Rate 02/20/21 1152 65     Resp 02/20/21 1152 18     Temp 02/20/21 1152 98.5 F (36.9 C)     Temp Source 02/20/21 1152 Oral     SpO2 02/20/21 1152 100 %     Weight --      Height --      Head Circumference --      Peak Flow --      Pain Score 02/20/21 1150 8     Pain Loc --      Pain Edu? --      Excl. in GC? --    No data found.  Updated Vital Signs BP 136/79 (BP Location: Right Arm)   Pulse 65   Temp 98.5 F (36.9 C) (Oral)   Resp 18   LMP  (LMP Unknown)   SpO2 100%   Visual Acuity Right Eye Distance:   Left Eye Distance:   Bilateral Distance:    Right Eye Near:   Left Eye Near:    Bilateral Near:     Physical Exam Vitals and nursing note reviewed.  Constitutional:      General: She is not in acute distress.    Appearance: Normal appearance. She is not  ill-appearing, toxic-appearing or diaphoretic.  HENT:     Head: Normocephalic and atraumatic.  Eyes:     Conjunctiva/sclera: Conjunctivae normal.  Cardiovascular:     Rate and Rhythm: Normal rate.     Pulses: Normal pulses.  Pulmonary:     Effort: Pulmonary effort is normal.  Abdominal:     General: Abdomen is flat.  Musculoskeletal:     Cervical back: Normal and normal range of motion.     Thoracic back: Normal.     Lumbar back: Tenderness present. No swelling, deformity or bony tenderness. Normal range of motion. Positive right straight leg raise test.  Skin:    General: Skin is warm and dry.  Neurological:     General: No focal deficit present.     Mental Status: She is alert and oriented to person, place, and time.  Psychiatric:        Mood and Affect: Mood normal.     UC Treatments / Results  Labs (all labs ordered are listed, but only abnormal results are displayed) Labs Reviewed - No data to display  EKG   Radiology No results found.  Procedures Procedures (including critical care time)  Medications Ordered in UC Medications - No data to display  Initial Impression / Assessment and Plan / UC Course  I have reviewed the triage vital signs and the nursing notes.  Pertinent labs & imaging results that were available during my care of the patient were reviewed by me and considered in my medical decision making (see chart for details).    Assessment negative for red flags or concerns.  Likely sciatica of the right side.  Will treat with prednisone daily for the next 5 days.  Once prednisone course is complete, may take ibuprofen or naproxen as needed for pain. May use heat, ice, or alternate between heat and ice for comfort.  Encouraged fluids and rest.  Patient given stretching and gentle exercises to help with pain  Follow up with primary  care as needed.   Final Clinical Impressions(s) / UC Diagnoses   Final diagnoses:  Sciatica of right side     Discharge  Instructions      Take the prednisone daily for the next 5 days.  Once you have finished the prednisone, you can take Advil (ibuprofen) or Aleve (naproxen) as needed for pain.    You can apply heat, ice, or alternate between heat and ice.   Rest and drink plenty of fluids.    Return or go to the Emergency Department if symptoms worsen or do not improve in the next few days.      ED Prescriptions     Medication Sig Dispense Auth. Provider   predniSONE (DELTASONE) 20 MG tablet Take 2 tablets (40 mg total) by mouth daily for 5 days. 10 tablet Ivette Loyal, NP      PDMP not reviewed this encounter.   Ivette Loyal, NP 02/20/21 1233

## 2021-02-20 NOTE — Discharge Instructions (Addendum)
Take the prednisone daily for the next 5 days.  Once you have finished the prednisone, you can take Advil (ibuprofen) or Aleve (naproxen) as needed for pain.    You can apply heat, ice, or alternate between heat and ice.   Rest and drink plenty of fluids.    Return or go to the Emergency Department if symptoms worsen or do not improve in the next few days.

## 2021-09-18 ENCOUNTER — Other Ambulatory Visit: Payer: Self-pay

## 2021-09-18 ENCOUNTER — Ambulatory Visit
Admission: EM | Admit: 2021-09-18 | Discharge: 2021-09-18 | Disposition: A | Discharge status: Home / Self Care | Payer: Medicaid Other

## 2021-09-18 ENCOUNTER — Encounter: Payer: Self-pay | Admitting: Emergency Medicine

## 2021-09-18 DIAGNOSIS — J069 Acute upper respiratory infection, unspecified: Secondary | ICD-10-CM

## 2021-09-18 NOTE — ED Provider Notes (Signed)
[facility]    CSN: [id number] Arrival date & time: 09/18/21  1621      History   Chief Complaint Chief Complaint  Patient presents with   Cough    HPI Priscilla Sherman is a 41 y.o. female.   Patient here today for evaluation of sore throat and cough that she has had for the last 3 days.  She reports that she has not had fever.  She has not taken any medication for treatment.  Daughter is here today with similar symptoms.  The history is provided by the patient.   Past Medical History:  Diagnosis Date   Anxiety    Chlamydia    Fracture of orbital floor, right side, initial encounter for closed fracture (HCC) 08/22/2019    Patient Active Problem List   Diagnosis Date Noted   IUD check up 04/21/2018   Smoking 08/26/2017    Past Surgical History:  Procedure Laterality Date   NO PAST SURGERIES      OB History     Gravida  5   Para  4   Term  4   Preterm      AB  1   Living  4      SAB      IAB  1   Ectopic      Multiple  0   Live Births  4            Home Medications    Prior to Admission medications   Medication Sig Start Date End Date Taking? Authorizing Provider  gabapentin (NEURONTIN) 300 MG capsule Take 1 capsule (300 mg total) by mouth 3 (three) times daily. Patient not taking: Reported on 09/18/2021 07/24/19   Elvina Sidle, MD  HYDROcodone-acetaminophen (NORCO/VICODIN) 5-325 MG tablet Take 1-2 tablets by mouth every 6 (six) hours as needed for severe pain. Patient not taking: Reported on 09/18/2021 10/06/19   Joy, Ines Bloomer C, PA-C  lidocaine (LIDODERM) 5 % Place 1 patch onto the skin daily. Remove & Discard patch within 12 hours or as directed by MD 10/06/19   Joy, Shawn C, PA-C  methocarbamol (ROBAXIN) 500 MG tablet Take 1 tablet (500 mg total) by mouth 2 (two) times daily. Patient not taking: Reported on 09/18/2021 10/06/19   Anselm Pancoast, PA-C    Family History Family History  Problem Relation Age of Onset   Thyroid  disease Mother        ?hypoactive   Cancer Father     Social History Social History   Tobacco Use   Smoking status: Some Days    Packs/day: 0.10    Years: 10.00    Pack years: 1.00    Types: Cigarettes   Smokeless tobacco: Never  Vaping Use   Vaping Use: Never used  Substance Use Topics   Alcohol use: Yes    Comment: socially   Drug use: Not Currently    Types: Marijuana    Comment: last used 9 montha ago     Allergies   Patient has no known allergies.   Review of Systems Review of Systems  Constitutional:  Negative for chills and fever.  HENT:  Positive for congestion and sore throat. Negative for ear pain.   Eyes:  Negative for discharge and redness.  Respiratory:  Positive for cough. Negative for shortness of breath and wheezing.   Gastrointestinal:  Negative for abdominal pain, diarrhea, nausea and vomiting.    Physical Exam Triage Vital Signs ED Triage Vitals  Enc Vitals Group     BP      Pulse      Resp      Temp      Temp src      SpO2      Weight      Height      Head Circumference      Peak Flow      Pain Score      Pain Loc      Pain Edu?      Excl. in GC?    No data found.  Updated Vital Signs BP 119/82 (BP Location: Left Arm)    Pulse 75    Temp 98.7 F (37.1 C) (Oral)    Resp 18    SpO2 96%      Physical Exam Vitals and nursing note reviewed.  Constitutional:      General: She is not in acute distress.    Appearance: Normal appearance. She is not ill-appearing.  HENT:     Head: Normocephalic and atraumatic.     Nose: Congestion present.     Mouth/Throat:     Mouth: Mucous membranes are moist.     Pharynx: Posterior oropharyngeal erythema present. No oropharyngeal exudate.  Eyes:     Conjunctiva/sclera: Conjunctivae normal.  Cardiovascular:     Rate and Rhythm: Normal rate and regular rhythm.     Heart sounds: Normal heart sounds. No murmur heard. Pulmonary:     Effort: Pulmonary effort is normal. No respiratory distress.      Breath sounds: Normal breath sounds. No wheezing, rhonchi or rales.  Skin:    General: Skin is warm and dry.  Neurological:     Mental Status: She is alert.  Psychiatric:        Mood and Affect: Mood normal.        Thought Content: Thought content normal.     UC Treatments / Results  Labs (all labs ordered are listed, but only abnormal results are displayed) Labs Reviewed - No data to display  EKG   Radiology No results found.  Procedures Procedures (including critical care time)  Medications Ordered in UC Medications - No data to display  Initial Impression / Assessment and Plan / UC Course  I have reviewed the triage vital signs and the nursing notes.  Pertinent labs & imaging results that were available during my care of the patient were reviewed by me and considered in my medical decision making (see chart for details).  Suspect viral etiology of symptoms.  Recommended symptomatic treatment and follow-up with any further concerns or persistent symptoms.   Final Clinical Impressions(s) / UC Diagnoses   Final diagnoses:  Acute upper respiratory infection   Discharge Instructions   None    ED Prescriptions   None    PDMP not reviewed this encounter.   Tomi Bamberger, PA-C 09/18/21 1753

## 2021-09-18 NOTE — ED Triage Notes (Signed)
Pt here for sore throat and cough x 3 days  

## 2022-09-17 ENCOUNTER — Encounter (HOSPITAL_COMMUNITY): Payer: Self-pay

## 2022-09-17 ENCOUNTER — Other Ambulatory Visit: Payer: Self-pay

## 2022-09-17 ENCOUNTER — Emergency Department (HOSPITAL_COMMUNITY)
Admission: EM | Admit: 2022-09-17 | Discharge: 2022-09-17 | Disposition: A | Discharge status: Home / Self Care | Payer: Medicaid Other | Attending: Emergency Medicine | Admitting: Emergency Medicine

## 2022-09-17 DIAGNOSIS — K0889 Other specified disorders of teeth and supporting structures: Secondary | ICD-10-CM | POA: Diagnosis present

## 2022-09-17 MED ORDER — HYDROCODONE-ACETAMINOPHEN 5-325 MG PO TABS
1.0000 | ORAL_TABLET | Freq: Once | ORAL | Status: AC
  Administered 2022-09-17: 1 via ORAL
  Filled 2022-09-17: qty 1

## 2022-09-17 MED ORDER — KETOROLAC TROMETHAMINE 30 MG/ML IJ SOLN
15.0000 mg | Freq: Once | INTRAMUSCULAR | Status: AC
  Administered 2022-09-17: 15 mg via INTRAMUSCULAR
  Filled 2022-09-17: qty 1

## 2022-09-17 MED ORDER — CHLORHEXIDINE GLUCONATE 0.12 % MT SOLN: 15.0000 mL | Freq: Two times a day (BID) | OROMUCOSAL | 0 refills | Status: AC

## 2022-09-17 MED ORDER — IBUPROFEN 400 MG PO TABS: 400.0000 mg | ORAL_TABLET | Freq: Three times a day (TID) | ORAL | 0 refills | Status: AC

## 2022-09-17 MED ORDER — HYDROCODONE-ACETAMINOPHEN 5-325 MG PO TABS: 1.0000 | ORAL_TABLET | Freq: Four times a day (QID) | ORAL | 0 refills | Status: DC | PRN

## 2022-09-17 NOTE — ED Provider Notes (Signed)
[facility] Provider Note   CSN: [id number] Arrival date & time: 09/17/22  0754     History  Chief Complaint  Patient presents with   Dental Pain    Priscilla Sherman is a 42 y.o. female.  HPI Presents with mouth pain.  She notes that she had her right upper and lower wisdom teeth removed a little more than 1 month ago, and has recently well with pain, severe, not proved with anything on the left side.  No fever, no other complaints.  No relief with OTC medication. She has not yet been able to follow-up with her dentist for anticipated removal of the left-sided wisdom teeth. She states that she is otherwise generally well.    Home Medications Prior to Admission medications   Medication Sig Start Date End Date Taking? Authorizing Provider  chlorhexidine (PERIDEX) 0.12 % solution Use as directed 15 mLs in the mouth or throat 2 (two) times daily for 4 days. 09/17/22 09/21/22 Yes Carmin Muskrat, MD  ibuprofen (ADVIL) 400 MG tablet Take 1 tablet (400 mg total) by mouth 3 (three) times daily for 3 days. Take one tablet three times daily for three days 09/17/22 09/20/22 Yes Carmin Muskrat, MD  HYDROcodone-acetaminophen (NORCO/VICODIN) 5-325 MG tablet Take 1 tablet by mouth every 6 (six) hours as needed for severe pain. 09/17/22   Carmin Muskrat, MD  lidocaine (LIDODERM) 5 % Place 1 patch onto the skin daily. Remove & Discard patch within 12 hours or as directed by MD 10/06/19   Joy, Shawn C, PA-C  methocarbamol (ROBAXIN) 500 MG tablet Take 1 tablet (500 mg total) by mouth 2 (two) times daily. Patient not taking: Reported on 09/18/2021 10/06/19   Lorayne Bender, PA-C      Allergies    Patient has no known allergies.    Review of Systems   Review of Systems  All other systems reviewed and are negative.   Physical Exam Updated Vital Signs BP (!) 164/100 (BP Location: Right Arm)   Pulse 69   Temp 98.4 F (36.9 C) (Oral)   Resp 14   Ht 5' 2"$   (1.575 m)   Wt 57.2 kg   SpO2 100%   BMI 23.05 kg/m  Physical Exam Vitals and nursing note reviewed.  Constitutional:      General: She is not in acute distress.    Appearance: She is well-developed.  HENT:     Head: Normocephalic and atraumatic.     Mouth/Throat:   Eyes:     Conjunctiva/sclera: Conjunctivae normal.  Pulmonary:     Effort: Pulmonary effort is normal. No respiratory distress.     Breath sounds: No stridor.  Abdominal:     General: There is no distension.  Skin:    General: Skin is warm and dry.  Neurological:     Mental Status: She is alert and oriented to person, place, and time.     Cranial Nerves: No cranial nerve deficit.  Psychiatric:        Mood and Affect: Mood normal.     ED Results / Procedures / Treatments   Labs (all labs ordered are listed, but only abnormal results are displayed) Labs Reviewed - No data to display  EKG None  Radiology No results found.  Procedures Procedures    Medications Ordered in ED Medications  ketorolac (TORADOL) 30 MG/ML injection 15 mg (has no administration in time range)  HYDROcodone-acetaminophen (NORCO/VICODIN) 5-325 MG per tablet 1 tablet (  has no administration in time range)    ED Course/ Medical Decision Making/ A&P                             Medical Decision Making Generally well adult female presents with dental pain.  No evidence for oropharyngeal compromise, no evidence for obvious infection, bacteremia, sepsis.  Patient encouraged to follow-up with dental, started on anti-inflammatories, analgesics, oral antiseptic.  Risk OTC drugs. Prescription drug management.  Final Clinical Impression(s) / ED Diagnoses Final diagnoses:  Pain, dental    Rx / DC Orders ED Discharge Orders          Ordered    ibuprofen (ADVIL) 400 MG tablet  3 times daily        09/17/22 0908    HYDROcodone-acetaminophen (NORCO/VICODIN) 5-325 MG tablet  Every 6 hours PRN        09/17/22 0908    chlorhexidine  (PERIDEX) 0.12 % solution  2 times daily        09/17/22 0908              Carmin Muskrat, MD 09/17/22 [phone]

## 2022-09-17 NOTE — ED Triage Notes (Signed)
Pt arrived POV from home c/o a toothache x1 week. Pt states her dentist can't get her in till march but she can't take the pain anymore it is causing the left side of her face and ear to hurt.

## 2022-09-17 NOTE — Discharge Instructions (Addendum)
Please be sure to follow-up with your dentist.  If you are unable to do so soon, please use the provided resources for additional options for follow-up care.  Return here for concerning changes in your condition.

## 2024-04-19 ENCOUNTER — Ambulatory Visit (INDEPENDENT_AMBULATORY_CARE_PROVIDER_SITE_OTHER): Admitting: Family Medicine

## 2024-04-19 VITALS — BP 121/85 | HR 92 | Temp 98.4°F | Resp 16 | Ht 62.0 in | Wt 100.0 lb

## 2024-04-19 DIAGNOSIS — F32A Depression, unspecified: Secondary | ICD-10-CM

## 2024-04-19 DIAGNOSIS — F17200 Nicotine dependence, unspecified, uncomplicated: Secondary | ICD-10-CM

## 2024-04-19 DIAGNOSIS — F419 Anxiety disorder, unspecified: Secondary | ICD-10-CM

## 2024-04-19 DIAGNOSIS — Z7689 Persons encountering health services in other specified circumstances: Secondary | ICD-10-CM

## 2024-04-19 MED ORDER — FLUOXETINE HCL 20 MG PO CAPS: 20.0000 mg | ORAL_CAPSULE | Freq: Every day | ORAL | 0 refills | Status: AC

## 2024-04-19 NOTE — Progress Notes (Unsigned)
 New Patient Office Visit  Subjective    Patient ID: Priscilla Sherman, female    DOB: 05/31/1981  Age: 43 y.o. MRN: [id number]  CC:  Chief Complaint  Patient presents with   Establish Care    ,Patient is requesting mental health mediation Patient is requesting removal of IUD Patient is here to established care with provider. ~health hx address ~care gaps address      HPI Priscilla Sherman presents to establish care. Patient reports that she is wanting to start a med for anxiety/depression.    Outpatient Encounter Medications as of 04/19/2024  Medication Sig   FLUoxetine  (PROZAC ) 20 MG capsule Take 1 capsule (20 mg total) by mouth daily.   [DISCONTINUED] HYDROcodone -acetaminophen  (NORCO/VICODIN) 5-325 MG tablet Take 1 tablet by mouth every 6 (six) hours as needed for severe pain.   [DISCONTINUED] lidocaine  (LIDODERM ) 5 % Place 1 patch onto the skin daily. Remove & Discard patch within 12 hours or as directed by MD   [DISCONTINUED] methocarbamol  (ROBAXIN ) 500 MG tablet Take 1 tablet (500 mg total) by mouth 2 (two) times daily. (Patient not taking: Reported on 09/18/2021)   No facility-administered encounter medications on file as of 04/19/2024.    Past Medical History:  Diagnosis Date   Anxiety    Chlamydia    Fracture of orbital floor, right side, initial encounter for closed fracture (HCC) 08/22/2019    Past Surgical History:  Procedure Laterality Date   NO PAST SURGERIES      Family History  Problem Relation Age of Onset   Thyroid disease Mother        ?hypoactive   Cancer Father     Social History   Socioeconomic History   Marital status: Single    Spouse name: Not on file   Number of children: Not on file   Years of education: Not on file   Highest education level: Some college, no degree  Occupational History   Not on file  Tobacco Use   Smoking status: Some Days    Current packs/day: 0.10    Average packs/day: 0.1 packs/day for 10.0 years (1.0 ttl pk-yrs)     Types: Cigarettes   Smokeless tobacco: Never  Vaping Use   Vaping status: Never Used  Substance and Sexual Activity   Alcohol use: Yes    Comment: socially   Drug use: Not Currently    Types: Marijuana    Comment: last used 9 montha ago   Sexual activity: Not Currently    Partners: Male    Birth control/protection: None  Other Topics Concern   Not on file  Social History Narrative   Not on file   Social Drivers of Health   Financial Resource Strain: Medium Risk (04/19/2024)   Overall Financial Resource Strain (CARDIA)    Difficulty of Paying Living Expenses: Somewhat hard  Food Insecurity: Food Insecurity Present (04/19/2024)   Hunger Vital Sign    Worried About Running Out of Food in the Last Year: Sometimes true    Ran Out of Food in the Last Year: Patient declined  Transportation Needs: Unmet Transportation Needs (04/19/2024)   PRAPARE - Administrator, Civil Service (Medical): Yes    Lack of Transportation (Non-Medical): No  Physical Activity: Sufficiently Active (04/19/2024)   Exercise Vital Sign    Days of Exercise per Week: 7 days    Minutes of Exercise per Session: 30 min  Stress: Stress Concern Present (04/19/2024)   [employer] of Occupational  Health - Occupational Stress Questionnaire    Feeling of Stress: Very much  Social Connections: Moderately Isolated (04/19/2024)   Social Connection and Isolation Panel    Frequency of Communication with Friends and Family: Three times a week    Frequency of Social Gatherings with Friends and Family: Once a week    Attends Religious Services: More than 4 times per year    Active Member of [employer] or Organizations: No    Attends Engineer, structural: Not on file    Marital Status: Never married  Intimate Partner Violence: Not At Risk (04/19/2024)   Humiliation, Afraid, Rape, and Kick questionnaire    Fear of Current or Ex-Partner: No    Emotionally Abused: No    Physically Abused: No    Sexually  Abused: No    Review of Systems  Psychiatric/Behavioral:  Positive for depression. Negative for suicidal ideas. The patient is nervous/anxious.   All other systems reviewed and are negative.       Objective   BP 121/85   Pulse 92   Temp 98.4 F (36.9 C) (Oral)   Resp 16   Ht 5' 2 (1.575 m)   Wt 100 lb (45.4 kg)   BMI 18.29 kg/m   Physical Exam Vitals and nursing note reviewed.  Constitutional:      General: She is not in acute distress. Cardiovascular:     Rate and Rhythm: Normal rate and regular rhythm.  Pulmonary:     Effort: Pulmonary effort is normal.     Breath sounds: Normal breath sounds.  Neurological:     General: No focal deficit present.     Mental Status: She is alert and oriented to person, place, and time.  Psychiatric:        Mood and Affect: Affect normal. Mood is anxious.        Behavior: Behavior normal.         Assessment & Plan:   1. Anxiety and depression (Primary) Prozac  20 mg started  2. Smoker Discussed reduction/cessation  3. Encounter to establish care      Return in about 4 weeks (around 05/17/2024) for follow up.   Tanda Raguel SQUIBB, MD

## 2024-04-21 ENCOUNTER — Encounter: Payer: Self-pay | Admitting: Family Medicine

## 2024-06-07 ENCOUNTER — Ambulatory Visit: Admitting: Family Medicine

## 2024-06-07 ENCOUNTER — Encounter: Payer: Self-pay | Admitting: Family Medicine

## 2024-06-07 VITALS — BP 137/92 | HR 77 | Ht 62.0 in | Wt 102.0 lb

## 2024-06-07 DIAGNOSIS — Z136 Encounter for screening for cardiovascular disorders: Secondary | ICD-10-CM

## 2024-06-07 DIAGNOSIS — Z0000 Encounter for general adult medical examination without abnormal findings: Secondary | ICD-10-CM

## 2024-06-07 DIAGNOSIS — Z13228 Encounter for screening for other metabolic disorders: Secondary | ICD-10-CM

## 2024-06-07 DIAGNOSIS — Z1159 Encounter for screening for other viral diseases: Secondary | ICD-10-CM

## 2024-06-07 DIAGNOSIS — Z1329 Encounter for screening for other suspected endocrine disorder: Secondary | ICD-10-CM | POA: Diagnosis not present

## 2024-06-07 DIAGNOSIS — Z130 Encounter for screening for diseases of the blood and blood-forming organs and certain disorders involving the immune mechanism: Secondary | ICD-10-CM | POA: Diagnosis not present

## 2024-06-07 DIAGNOSIS — Z1231 Encounter for screening mammogram for malignant neoplasm of breast: Secondary | ICD-10-CM

## 2024-06-07 NOTE — Progress Notes (Unsigned)
 Established Patient Office Visit  Subjective    Patient ID: Priscilla Sherman, female    DOB: 1981/05/23  Age: 43 y.o. MRN: [id number]  CC:  Chief Complaint  Patient presents with   Annual Exam    HPI Priscilla Sherman presents for routine annual exam. Patient denies acute complaints.   Outpatient Encounter Medications as of 06/07/2024  Medication Sig   FLUoxetine  (PROZAC ) 20 MG capsule Take 1 capsule (20 mg total) by mouth daily.   No facility-administered encounter medications on file as of 06/07/2024.    Past Medical History:  Diagnosis Date   Anxiety    Chlamydia    Fracture of orbital floor, right side, initial encounter for closed fracture (HCC) 08/22/2019    Past Surgical History:  Procedure Laterality Date   NO PAST SURGERIES      Family History  Problem Relation Age of Onset   Thyroid disease Mother        ?hypoactive   Cancer Father     Social History   Socioeconomic History   Marital status: Single    Spouse name: Not on file   Number of children: Not on file   Years of education: Not on file   Highest education level: Some college, no degree  Occupational History   Not on file  Tobacco Use   Smoking status: Some Days    Current packs/day: 0.10    Average packs/day: 0.1 packs/day for 10.0 years (1.0 ttl pk-yrs)    Types: Cigarettes   Smokeless tobacco: Never  Vaping Use   Vaping status: Never Used  Substance and Sexual Activity   Alcohol use: Yes    Comment: socially   Drug use: Not Currently    Types: Marijuana    Comment: last used 9 montha ago   Sexual activity: Not Currently    Partners: Male    Birth control/protection: None  Other Topics Concern   Not on file  Social History Narrative   Not on file   Social Drivers of Health   Financial Resource Strain: Medium Risk (04/19/2024)   Overall Financial Resource Strain (CARDIA)    Difficulty of Paying Living Expenses: Somewhat hard  Food Insecurity: Food Insecurity Present (04/19/2024)    Hunger Vital Sign    Worried About Running Out of Food in the Last Year: Sometimes true    Ran Out of Food in the Last Year: Patient declined  Transportation Needs: Unmet Transportation Needs (04/19/2024)   PRAPARE - Administrator, Civil Service (Medical): Yes    Lack of Transportation (Non-Medical): No  Physical Activity: Sufficiently Active (04/19/2024)   Exercise Vital Sign    Days of Exercise per Week: 7 days    Minutes of Exercise per Session: 30 min  Stress: Stress Concern Present (04/19/2024)   [employer] of Occupational Health - Occupational Stress Questionnaire    Feeling of Stress: Very much  Social Connections: Moderately Isolated (04/19/2024)   Social Connection and Isolation Panel    Frequency of Communication with Friends and Family: Three times a week    Frequency of Social Gatherings with Friends and Family: Once a week    Attends Religious Services: More than 4 times per year    Active Member of [employer] or Organizations: No    Attends Banker Meetings: Not on file    Marital Status: Never married  Intimate Partner Violence: Not At Risk (04/19/2024)   Humiliation, Afraid, Rape, and Kick questionnaire  Fear of Current or Ex-Partner: No    Emotionally Abused: No    Physically Abused: No    Sexually Abused: No    Review of Systems  All other systems reviewed and are negative.       Objective    BP (!) 156/94   Pulse 77   Ht 5' 2 (1.575 m)   Wt 102 lb (46.3 kg)   SpO2 98%   BMI 18.66 kg/m   Physical Exam Vitals and nursing note reviewed.  Constitutional:      General: She is not in acute distress. HENT:     Head: Normocephalic and atraumatic.     Right Ear: Tympanic membrane, ear canal and external ear normal.     Left Ear: Tympanic membrane, ear canal and external ear normal.     Nose: Nose normal.     Mouth/Throat:     Mouth: Mucous membranes are moist.     Pharynx: Oropharynx is clear.  Eyes:      Conjunctiva/sclera: Conjunctivae normal.     Pupils: Pupils are equal, round, and reactive to light.  Neck:     Thyroid: No thyromegaly.  Cardiovascular:     Rate and Rhythm: Normal rate and regular rhythm.     Heart sounds: Normal heart sounds. No murmur heard. Pulmonary:     Effort: Pulmonary effort is normal. No respiratory distress.     Breath sounds: Normal breath sounds.  Abdominal:     General: There is no distension.     Palpations: Abdomen is soft. There is no mass.     Tenderness: There is no abdominal tenderness.  Musculoskeletal:        General: Normal range of motion.     Cervical back: Normal range of motion and neck supple.  Skin:    General: Skin is warm and dry.  Neurological:     General: No focal deficit present.     Mental Status: She is alert and oriented to person, place, and time.  Psychiatric:        Mood and Affect: Mood normal.        Behavior: Behavior normal.         Assessment & Plan:   Annual physical exam -     Comprehensive metabolic panel with GFR  Screening for deficiency anemia -     CBC with Differential/Platelet  Encounter for screening for cardiovascular disorders -     Lipid panel  Screening for endocrine/metabolic/immunity disorders -     Hemoglobin A1c -     VITAMIN D  25 Hydroxy (Vit-D Deficiency, Fractures)  Encounter for screening mammogram for malignant neoplasm of breast -     3D Screening Mammogram, Left and Right; Future  Need for hepatitis C screening test -     Hepatitis C antibody     No follow-ups on file.   Tanda Raguel SQUIBB, MD

## 2024-06-08 ENCOUNTER — Ambulatory Visit: Payer: Self-pay | Admitting: Family Medicine

## 2024-06-08 ENCOUNTER — Encounter: Payer: Self-pay | Admitting: Family Medicine

## 2024-06-08 LAB — HEMOGLOBIN A1C
Est. average glucose Bld gHb Est-mCnc: <74 mg/dL
Hgb A1c MFr Bld: <4.2 % — ABNORMAL LOW (ref 4.8–5.6)

## 2024-06-08 LAB — TSH RFX ON ABNORMAL TO FREE T4: TSH: 0.682 u[IU]/mL (ref 0.450–4.500)

## 2024-06-08 LAB — CBC WITH DIFFERENTIAL/PLATELET
Basophils Absolute: 0 x10E3/uL (ref 0.0–0.2)
Basos: 0 %
EOS (ABSOLUTE): 0.1 x10E3/uL (ref 0.0–0.4)
Eos: 1 %
Hematocrit: 38.8 % (ref 34.0–46.6)
Hemoglobin: 13.7 g/dL (ref 11.1–15.9)
Immature Grans (Abs): 0 x10E3/uL (ref 0.0–0.1)
Immature Granulocytes: 0 %
Lymphocytes Absolute: 2.1 x10E3/uL (ref 0.7–3.1)
Lymphs: 26 %
MCH: 38 pg — ABNORMAL HIGH (ref 26.6–33.0)
MCHC: 35.3 g/dL (ref 31.5–35.7)
MCV: 108 fL — ABNORMAL HIGH (ref 79–97)
Monocytes Absolute: 0.6 x10E3/uL (ref 0.1–0.9)
Monocytes: 7 %
Neutrophils Absolute: 5.5 x10E3/uL (ref 1.4–7.0)
Neutrophils: 66 %
Platelets: 270 x10E3/uL (ref 150–450)
RBC: 3.61 x10E6/uL — ABNORMAL LOW (ref 3.77–5.28)
RDW: 13.2 % (ref 11.7–15.4)
WBC: 8.3 x10E3/uL (ref 3.4–10.8)

## 2024-06-08 LAB — VITAMIN D 25 HYDROXY (VIT D DEFICIENCY, FRACTURES): Vit D, 25-Hydroxy: 5.8 ng/mL — ABNORMAL LOW (ref 30.0–100.0)

## 2024-06-08 LAB — COMPREHENSIVE METABOLIC PANEL WITH GFR
ALT: 125 IU/L — ABNORMAL HIGH (ref 0–32)
AST: 197 IU/L — ABNORMAL HIGH (ref 0–40)
Albumin: 4.3 g/dL (ref 3.9–4.9)
Alkaline Phosphatase: 151 IU/L — ABNORMAL HIGH (ref 41–116)
BUN/Creatinine Ratio: 5 — ABNORMAL LOW (ref 9–23)
BUN: 3 mg/dL — ABNORMAL LOW (ref 6–24)
Bilirubin Total: 1.1 mg/dL (ref 0.0–1.2)
CO2: 26 mmol/L (ref 20–29)
Calcium: 9.1 mg/dL (ref 8.7–10.2)
Chloride: 97 mmol/L (ref 96–106)
Creatinine, Ser: 0.61 mg/dL (ref 0.57–1.00)
Globulin, Total: 2.2 g/dL (ref 1.5–4.5)
Glucose: 58 mg/dL — ABNORMAL LOW (ref 70–99)
Potassium: 3.1 mmol/L — ABNORMAL LOW (ref 3.5–5.2)
Sodium: 140 mmol/L (ref 134–144)
Total Protein: 6.5 g/dL (ref 6.0–8.5)
eGFR: 114 mL/min/1.73 (ref >59)

## 2024-06-08 LAB — LIPID PANEL
Chol/HDL Ratio: 1.8 ratio (ref 0.0–4.4)
Cholesterol, Total: 242 mg/dL — ABNORMAL HIGH (ref 100–199)
HDL: 134 mg/dL (ref >39)
LDL Chol Calc (NIH): 94 mg/dL (ref 0–99)
Triglycerides: 86 mg/dL (ref 0–149)
VLDL Cholesterol Cal: 14 mg/dL (ref 5–40)

## 2024-06-08 LAB — HEPATITIS C ANTIBODY: Hep C Virus Ab: NONREACTIVE

## 2024-06-15 ENCOUNTER — Ambulatory Visit
Admission: RE | Admit: 2024-06-15 | Discharge: 2024-06-15 | Disposition: A | Discharge status: Home / Self Care | Source: Ambulatory Visit | Attending: Family Medicine | Admitting: Family Medicine

## 2024-06-15 DIAGNOSIS — Z1231 Encounter for screening mammogram for malignant neoplasm of breast: Secondary | ICD-10-CM

## 2024-06-16 MED ORDER — VITAMIN D (ERGOCALCIFEROL) 1.25 MG (50000 UNIT) PO CAPS: 50000.0000 [IU] | ORAL_CAPSULE | ORAL | 0 refills | Status: AC

## 2024-06-25 ENCOUNTER — Other Ambulatory Visit

## 2024-06-28 ENCOUNTER — Other Ambulatory Visit: Payer: Self-pay | Admitting: Family Medicine

## 2024-06-28 ENCOUNTER — Other Ambulatory Visit

## 2024-06-28 DIAGNOSIS — D7589 Other specified diseases of blood and blood-forming organs: Secondary | ICD-10-CM

## 2024-06-29 LAB — B12 AND FOLATE PANEL
Folate: 2.4 ng/mL — ABNORMAL LOW (ref >3.0)
Vitamin B-12: 571 pg/mL (ref 232–1245)

## 2024-06-29 LAB — SPECIMEN STATUS REPORT

## 2024-07-20 ENCOUNTER — Ambulatory Visit: Payer: Self-pay | Admitting: Family Medicine

## 2024-07-20 MED ORDER — FOLIC ACID 1 MG PO TABS: 1.0000 mg | ORAL_TABLET | Freq: Every day | ORAL | 0 refills | Status: AC

## 2024-07-20 NOTE — Progress Notes (Signed)
 Patient reviewed lab results and provider recommendations via MyChart

## 2024-07-28 ENCOUNTER — Ambulatory Visit (INDEPENDENT_AMBULATORY_CARE_PROVIDER_SITE_OTHER): Admitting: Family Medicine

## 2024-07-28 ENCOUNTER — Other Ambulatory Visit (HOSPITAL_COMMUNITY)
Admission: RE | Admit: 2024-07-28 | Discharge: 2024-07-28 | Disposition: A | Discharge status: Home / Self Care | Source: Ambulatory Visit | Attending: Family Medicine | Admitting: Family Medicine

## 2024-07-28 ENCOUNTER — Encounter: Payer: Self-pay | Admitting: Family Medicine

## 2024-07-28 VITALS — BP 150/97 | HR 81 | Ht 62.0 in | Wt 108.8 lb

## 2024-07-28 DIAGNOSIS — Z124 Encounter for screening for malignant neoplasm of cervix: Secondary | ICD-10-CM | POA: Insufficient documentation

## 2024-07-28 DIAGNOSIS — Z113 Encounter for screening for infections with a predominantly sexual mode of transmission: Secondary | ICD-10-CM

## 2024-07-28 NOTE — Progress Notes (Signed)
 "  Established Patient Office Visit  Subjective    Patient ID: Priscilla Sherman, female    DOB: May 25, 1981  Age: 43 y.o. MRN: [id number]  CC:  Chief Complaint  Patient presents with   Gynecologic Exam    HPI Priscilla Sherman presents for routine pap and pelvic. Patient denies acute complaints.   Outpatient Encounter Medications as of 07/28/2024  Medication Sig   FLUoxetine  (PROZAC ) 20 MG capsule Take 1 capsule (20 mg total) by mouth daily.   folic acid  (FOLVITE ) 1 MG tablet Take 1 tablet (1 mg total) by mouth daily.   Vitamin D , Ergocalciferol , (DRISDOL ) 1.25 MG (50000 UNIT) CAPS capsule Take 1 capsule (50,000 Units total) by mouth every 7 (seven) days.   No facility-administered encounter medications on file as of 07/28/2024.    Past Medical History:  Diagnosis Date   Anxiety    Chlamydia    Fracture of orbital floor, right side, initial encounter for closed fracture (HCC) 08/22/2019    Past Surgical History:  Procedure Laterality Date   NO PAST SURGERIES      Family History  Problem Relation Age of Onset   Thyroid disease Mother        ?hypoactive   Cancer Father     Social History   Socioeconomic History   Marital status: Single    Spouse name: Not on file   Number of children: Not on file   Years of education: Not on file   Highest education level: Some college, no degree  Occupational History   Not on file  Tobacco Use   Smoking status: Some Days    Current packs/day: 0.10    Average packs/day: 0.1 packs/day for 10.0 years (1.0 ttl pk-yrs)    Types: Cigarettes   Smokeless tobacco: Never  Vaping Use   Vaping status: Never Used  Substance and Sexual Activity   Alcohol use: Yes    Comment: socially   Drug use: Not Currently    Types: Marijuana    Comment: last used 9 montha ago   Sexual activity: Not Currently    Partners: Male    Birth control/protection: None  Other Topics Concern   Not on file  Social History Narrative   Not on file   Social  Drivers of Health   Tobacco Use: High Risk (07/28/2024)   Patient History    Smoking Tobacco Use: Some Days    Smokeless Tobacco Use: Never    Passive Exposure: Not on file  Financial Resource Strain: Medium Risk (07/27/2024)   Overall Financial Resource Strain (CARDIA)    Difficulty of Paying Living Expenses: Somewhat hard  Food Insecurity: Food Insecurity Present (07/27/2024)   Epic    Worried About Programme Researcher, Broadcasting/film/video in the Last Year: Never true    Ran Out of Food in the Last Year: Sometimes true  Transportation Needs: Unmet Transportation Needs (07/27/2024)   Epic    Lack of Transportation (Medical): Yes    Lack of Transportation (Non-Medical): No  Physical Activity: Sufficiently Active (07/27/2024)   Exercise Vital Sign    Days of Exercise per Week: 5 days    Minutes of Exercise per Session: 40 min  Stress: Stress Concern Present (07/27/2024)   [employer] of Occupational Health - Occupational Stress Questionnaire    Feeling of Stress: Rather much  Social Connections: Moderately Isolated (07/27/2024)   Social Connection and Isolation Panel    Frequency of Communication with Friends and Family: Three times a week  Frequency of Social Gatherings with Friends and Family: Once a week    Attends Religious Services: More than 4 times per year    Active Member of [employer] or Organizations: No    Attends Engineer, Structural: Not on file    Marital Status: Never married  Intimate Partner Violence: Not At Risk (04/19/2024)   Epic    Fear of Current or Ex-Partner: No    Emotionally Abused: No    Physically Abused: No    Sexually Abused: No  Depression (PHQ2-9): Medium Risk (04/19/2024)   Depression (PHQ2-9)    PHQ-2 Score: 10  Alcohol Screen: Low Risk (07/27/2024)   Alcohol Screen    Last Alcohol Screening Score (AUDIT): 6  Housing: High Risk (07/27/2024)   Epic    Unable to Pay for Housing in the Last Year: Yes    Number of Times Moved in the Last Year: 0     Homeless in the Last Year: No  Utilities: Not At Risk (04/19/2024)   Epic    Threatened with loss of utilities: No  Health Literacy: Adequate Health Literacy (04/19/2024)   B1300 Health Literacy    Frequency of need for help with medical instructions: Never    Review of Systems  All other systems reviewed and are negative.       Objective    BP (!) 150/97   Pulse 81   Ht 5' 2 (1.575 m)   Wt 108 lb 12.8 oz (49.4 kg)   LMP 07/28/2024 (Exact Date)   SpO2 99%   BMI 19.90 kg/m   Physical Exam Vitals and nursing note reviewed. Exam conducted with a chaperone present.  Constitutional:      General: She is not in acute distress. Cardiovascular:     Rate and Rhythm: Normal rate and regular rhythm.  Pulmonary:     Effort: Pulmonary effort is normal.     Breath sounds: Normal breath sounds.  Abdominal:     Palpations: Abdomen is soft.     Tenderness: There is no abdominal tenderness.  Genitourinary:    Labia:        Right: No rash.        Left: No rash.      Vagina: Normal.     Cervix: Normal.     Uterus: Normal.      Adnexa: Right adnexa normal and left adnexa normal.     Rectum: Normal.     Comments: Pierced labia  Lymphadenopathy:     Lower Body: No right inguinal adenopathy. No left inguinal adenopathy.  Neurological:     General: No focal deficit present.     Mental Status: She is alert and oriented to person, place, and time.         Assessment & Plan:   Pap smear for cervical cancer screening  Screening for STDs (sexually transmitted diseases)     No follow-ups on file.   Tanda Raguel SQUIBB, MD  "

## 2024-07-28 NOTE — Addendum Note (Signed)
 Addended by: Lusine Corlett E on: 07/28/2024 11:54 AM   Modules accepted: Orders

## 2024-07-30 LAB — CERVICOVAGINAL ANCILLARY ONLY
Bacterial Vaginitis (gardnerella): POSITIVE — AB
Candida Glabrata: NEGATIVE
Candida Vaginitis: NEGATIVE
Chlamydia: NEGATIVE
Comment: NEGATIVE
Comment: NEGATIVE
Comment: NEGATIVE
Comment: NEGATIVE
Comment: NEGATIVE
Comment: NORMAL
Neisseria Gonorrhea: NEGATIVE
Trichomonas: NEGATIVE

## 2024-08-02 ENCOUNTER — Ambulatory Visit: Payer: Self-pay | Admitting: Family

## 2024-08-02 DIAGNOSIS — B9689 Other specified bacterial agents as the cause of diseases classified elsewhere: Secondary | ICD-10-CM

## 2024-08-02 LAB — CYTOLOGY - PAP: Diagnosis: NEGATIVE

## 2024-08-02 MED ORDER — METRONIDAZOLE 500 MG PO TABS: 500.0000 mg | ORAL_TABLET | Freq: Two times a day (BID) | ORAL | 0 refills | Status: AC

## 2024-09-08 ENCOUNTER — Other Ambulatory Visit: Payer: Self-pay | Admitting: Family Medicine
